# Patient Record
Sex: Female | Born: 1935 | Race: White | Hispanic: No | Marital: Married | Smoking: Never smoker
Health system: Southern US, Community
[De-identification: ages and names within clinical notes are randomized; demographics above are authoritative.]

## PROBLEM LIST (undated history)

## (undated) DIAGNOSIS — H3530 Unspecified macular degeneration: Secondary | ICD-10-CM

## (undated) DIAGNOSIS — IMO0002 Reserved for concepts with insufficient information to code with codable children: Secondary | ICD-10-CM

## (undated) DIAGNOSIS — E785 Hyperlipidemia, unspecified: Secondary | ICD-10-CM

## (undated) DIAGNOSIS — I10 Essential (primary) hypertension: Secondary | ICD-10-CM

## (undated) DIAGNOSIS — F0390 Unspecified dementia without behavioral disturbance: Secondary | ICD-10-CM

## (undated) HISTORY — PX: SKIN CANCER EXCISION: SHX779

## (undated) HISTORY — PX: CATARACT EXTRACTION: SUR2

---

## 2004-06-05 ENCOUNTER — Ambulatory Visit: Payer: Self-pay | Admitting: Internal Medicine

## 2005-01-22 ENCOUNTER — Ambulatory Visit: Payer: Self-pay | Admitting: Internal Medicine

## 2006-03-20 ENCOUNTER — Ambulatory Visit: Payer: Self-pay | Admitting: Internal Medicine

## 2007-03-25 ENCOUNTER — Ambulatory Visit: Payer: Self-pay | Admitting: Internal Medicine

## 2008-06-03 ENCOUNTER — Ambulatory Visit: Payer: Self-pay | Admitting: Internal Medicine

## 2009-06-28 ENCOUNTER — Ambulatory Visit: Payer: Self-pay | Admitting: Internal Medicine

## 2010-07-18 ENCOUNTER — Ambulatory Visit: Payer: Self-pay | Admitting: Internal Medicine

## 2011-08-28 ENCOUNTER — Ambulatory Visit: Payer: Self-pay | Admitting: Internal Medicine

## 2012-09-17 ENCOUNTER — Ambulatory Visit: Payer: Self-pay | Admitting: Neurology

## 2012-10-01 ENCOUNTER — Ambulatory Visit: Payer: Self-pay

## 2014-11-09 ENCOUNTER — Other Ambulatory Visit: Payer: Self-pay | Admitting: Physician Assistant

## 2014-11-09 DIAGNOSIS — M5441 Lumbago with sciatica, right side: Secondary | ICD-10-CM

## 2014-11-10 ENCOUNTER — Ambulatory Visit
Admission: RE | Admit: 2014-11-10 | Discharge: 2014-11-10 | Disposition: A | Discharge status: Home / Self Care | Payer: Medicare Other | Source: Ambulatory Visit | Attending: Physician Assistant | Admitting: Physician Assistant

## 2014-11-10 DIAGNOSIS — M47896 Other spondylosis, lumbar region: Secondary | ICD-10-CM | POA: Diagnosis not present

## 2014-11-10 DIAGNOSIS — M4856XA Collapsed vertebra, not elsewhere classified, lumbar region, initial encounter for fracture: Secondary | ICD-10-CM | POA: Insufficient documentation

## 2014-11-10 DIAGNOSIS — M5441 Lumbago with sciatica, right side: Secondary | ICD-10-CM

## 2014-11-10 DIAGNOSIS — M5186 Other intervertebral disc disorders, lumbar region: Secondary | ICD-10-CM | POA: Diagnosis not present

## 2014-11-18 ENCOUNTER — Encounter: Payer: Self-pay | Admitting: Emergency Medicine

## 2014-11-18 ENCOUNTER — Inpatient Hospital Stay
Admission: EM | Admit: 2014-11-18 | Discharge: 2014-11-22 | DRG: 394 | Disposition: A | Discharge status: Skilled Nursing Facility | Payer: Medicare Other | Attending: Internal Medicine | Admitting: Internal Medicine

## 2014-11-18 ENCOUNTER — Emergency Department: Payer: Medicare Other

## 2014-11-18 DIAGNOSIS — G8929 Other chronic pain: Secondary | ICD-10-CM | POA: Diagnosis present

## 2014-11-18 DIAGNOSIS — H3530 Unspecified macular degeneration: Secondary | ICD-10-CM | POA: Diagnosis present

## 2014-11-18 DIAGNOSIS — K649 Unspecified hemorrhoids: Secondary | ICD-10-CM | POA: Diagnosis present

## 2014-11-18 DIAGNOSIS — Z85828 Personal history of other malignant neoplasm of skin: Secondary | ICD-10-CM | POA: Diagnosis not present

## 2014-11-18 DIAGNOSIS — Z79891 Long term (current) use of opiate analgesic: Secondary | ICD-10-CM | POA: Diagnosis not present

## 2014-11-18 DIAGNOSIS — E785 Hyperlipidemia, unspecified: Secondary | ICD-10-CM | POA: Diagnosis present

## 2014-11-18 DIAGNOSIS — K625 Hemorrhage of anus and rectum: Secondary | ICD-10-CM

## 2014-11-18 DIAGNOSIS — K626 Ulcer of anus and rectum: Principal | ICD-10-CM | POA: Diagnosis present

## 2014-11-18 DIAGNOSIS — I10 Essential (primary) hypertension: Secondary | ICD-10-CM | POA: Diagnosis present

## 2014-11-18 DIAGNOSIS — K921 Melena: Secondary | ICD-10-CM | POA: Diagnosis present

## 2014-11-18 DIAGNOSIS — Z79899 Other long term (current) drug therapy: Secondary | ICD-10-CM

## 2014-11-18 DIAGNOSIS — M4850XA Collapsed vertebra, not elsewhere classified, site unspecified, initial encounter for fracture: Secondary | ICD-10-CM | POA: Diagnosis present

## 2014-11-18 DIAGNOSIS — Z7982 Long term (current) use of aspirin: Secondary | ICD-10-CM | POA: Diagnosis not present

## 2014-11-18 DIAGNOSIS — K5730 Diverticulosis of large intestine without perforation or abscess without bleeding: Secondary | ICD-10-CM | POA: Diagnosis present

## 2014-11-18 DIAGNOSIS — F0390 Unspecified dementia without behavioral disturbance: Secondary | ICD-10-CM | POA: Diagnosis present

## 2014-11-18 DIAGNOSIS — M549 Dorsalgia, unspecified: Secondary | ICD-10-CM | POA: Diagnosis present

## 2014-11-18 DIAGNOSIS — Z66 Do not resuscitate: Secondary | ICD-10-CM | POA: Diagnosis present

## 2014-11-18 DIAGNOSIS — K922 Gastrointestinal hemorrhage, unspecified: Secondary | ICD-10-CM | POA: Diagnosis present

## 2014-11-18 DIAGNOSIS — K5641 Fecal impaction: Secondary | ICD-10-CM | POA: Diagnosis present

## 2014-11-18 HISTORY — DX: Unspecified dementia, unspecified severity, without behavioral disturbance, psychotic disturbance, mood disturbance, and anxiety: F03.90

## 2014-11-18 HISTORY — DX: Reserved for concepts with insufficient information to code with codable children: IMO0002

## 2014-11-18 HISTORY — DX: Hyperlipidemia, unspecified: E78.5

## 2014-11-18 HISTORY — DX: Essential (primary) hypertension: I10

## 2014-11-18 HISTORY — DX: Unspecified macular degeneration: H35.30

## 2014-11-18 LAB — URINALYSIS COMPLETE WITH MICROSCOPIC (ARMC ONLY)
BACTERIA UA: NONE SEEN
Bilirubin Urine: NEGATIVE
Glucose, UA: NEGATIVE mg/dL
Leukocytes, UA: NEGATIVE
NITRITE: NEGATIVE
Protein, ur: NEGATIVE mg/dL
SQUAMOUS EPITHELIAL / LPF: NONE SEEN
Specific Gravity, Urine: 1.025 (ref 1.005–1.030)
pH: 6 (ref 5.0–8.0)

## 2014-11-18 LAB — COMPREHENSIVE METABOLIC PANEL
ALK PHOS: 148 U/L — AB (ref 38–126)
ALT: 35 U/L (ref 14–54)
AST: 38 U/L (ref 15–41)
Albumin: 3.4 g/dL — ABNORMAL LOW (ref 3.5–5.0)
Anion gap: 10 (ref 5–15)
BUN: 37 mg/dL — ABNORMAL HIGH (ref 6–20)
CHLORIDE: 105 mmol/L (ref 101–111)
CO2: 29 mmol/L (ref 22–32)
Calcium: 9.1 mg/dL (ref 8.9–10.3)
Creatinine, Ser: 0.81 mg/dL (ref 0.44–1.00)
GFR calc non Af Amer: >60 mL/min (ref >60)
GLUCOSE: 121 mg/dL — AB (ref 65–99)
POTASSIUM: 3.4 mmol/L — AB (ref 3.5–5.1)
Sodium: 144 mmol/L (ref 135–145)
Total Bilirubin: 0.6 mg/dL (ref 0.3–1.2)
Total Protein: 7.4 g/dL (ref 6.5–8.1)

## 2014-11-18 LAB — CBC WITH DIFFERENTIAL/PLATELET
Basophils Absolute: 0 10*3/uL (ref 0–0.1)
Basophils Relative: 0 %
Eosinophils Absolute: 0 10*3/uL (ref 0–0.7)
Eosinophils Relative: 0 %
HCT: 40.7 % (ref 35.0–47.0)
Hemoglobin: 13.3 g/dL (ref 12.0–16.0)
LYMPHS ABS: 1.6 10*3/uL (ref 1.0–3.6)
Lymphocytes Relative: 16 %
MCH: 28.9 pg (ref 26.0–34.0)
MCHC: 32.6 g/dL (ref 32.0–36.0)
MCV: 88.4 fL (ref 80.0–100.0)
Monocytes Absolute: 0.7 10*3/uL (ref 0.2–0.9)
Monocytes Relative: 7 %
Neutro Abs: 7.3 10*3/uL — ABNORMAL HIGH (ref 1.4–6.5)
Neutrophils Relative %: 77 %
PLATELETS: 307 10*3/uL (ref 150–440)
RBC: 4.6 MIL/uL (ref 3.80–5.20)
RDW: 15 % — ABNORMAL HIGH (ref 11.5–14.5)
WBC: 9.5 10*3/uL (ref 3.6–11.0)

## 2014-11-18 LAB — TYPE AND SCREEN
ABO/RH(D): O NEG
Antibody Screen: NEGATIVE

## 2014-11-18 LAB — HEMOGLOBIN
HEMOGLOBIN: 12.5 g/dL (ref 12.0–16.0)
HEMOGLOBIN: 12.2 g/dL (ref 12.0–16.0)

## 2014-11-18 LAB — ABO/RH: ABO/RH(D): O NEG

## 2014-11-18 LAB — TROPONIN I: Troponin I: <0.03 ng/mL (ref <0.031)

## 2014-11-18 LAB — TSH: TSH: 1.297 u[IU]/mL (ref 0.350–4.500)

## 2014-11-18 MED ORDER — CIPROFLOXACIN IN D5W 400 MG/200ML IV SOLN
400.0000 mg | Freq: Two times a day (BID) | INTRAVENOUS | Status: DC
  Administered 2014-11-18 – 2014-11-20 (×4): 400 mg via INTRAVENOUS
  Filled 2014-11-18 (×6): qty 200

## 2014-11-18 MED ORDER — METRONIDAZOLE IN NACL 5-0.79 MG/ML-% IV SOLN
INTRAVENOUS | Status: AC
  Administered 2014-11-18: 500 mg via INTRAVENOUS
  Filled 2014-11-18: qty 100

## 2014-11-18 MED ORDER — LATANOPROST 0.005 % OP SOLN
1.0000 [drp] | Freq: Every day | OPHTHALMIC | Status: DC
  Administered 2014-11-18 – 2014-11-21 (×4): 1 [drp] via OPHTHALMIC
  Filled 2014-11-18: qty 2.5

## 2014-11-18 MED ORDER — DONEPEZIL HCL 5 MG PO TABS
10.0000 mg | ORAL_TABLET | Freq: Every day | ORAL | Status: DC
  Administered 2014-11-18 – 2014-11-21 (×4): 10 mg via ORAL
  Filled 2014-11-18 (×4): qty 2

## 2014-11-18 MED ORDER — POLYETHYLENE GLYCOL 3350 17 G PO PACK
17.0000 g | PACK | Freq: Every day | ORAL | Status: DC
  Filled 2014-11-18: qty 1

## 2014-11-18 MED ORDER — ACETAMINOPHEN 650 MG RE SUPP: 650.0000 mg | Freq: Four times a day (QID) | RECTAL | Status: DC | PRN

## 2014-11-18 MED ORDER — SODIUM CHLORIDE 0.9 % IV SOLN
INTRAVENOUS | Status: DC
  Administered 2014-11-18 – 2014-11-19 (×2): via INTRAVENOUS

## 2014-11-18 MED ORDER — PANTOPRAZOLE SODIUM 40 MG IV SOLR
40.0000 mg | Freq: Once | INTRAVENOUS | Status: AC
  Administered 2014-11-18: 40 mg via INTRAVENOUS

## 2014-11-18 MED ORDER — PANTOPRAZOLE SODIUM 40 MG IV SOLR
INTRAVENOUS | Status: AC
  Administered 2014-11-18: 40 mg via INTRAVENOUS
  Filled 2014-11-18: qty 40

## 2014-11-18 MED ORDER — DOCUSATE SODIUM 100 MG PO CAPS
100.0000 mg | ORAL_CAPSULE | Freq: Two times a day (BID) | ORAL | Status: DC
  Administered 2014-11-18 – 2014-11-22 (×8): 100 mg via ORAL
  Filled 2014-11-18 (×8): qty 1

## 2014-11-18 MED ORDER — IOHEXOL 240 MG/ML SOLN
25.0000 mL | INTRAMUSCULAR | Status: AC
Start: 2014-11-18 — End: 2014-11-18

## 2014-11-18 MED ORDER — CIPROFLOXACIN IN D5W 400 MG/200ML IV SOLN
INTRAVENOUS | Status: AC
  Administered 2014-11-18: 400 mg via INTRAVENOUS
  Filled 2014-11-18: qty 200

## 2014-11-18 MED ORDER — OXYCODONE HCL 5 MG PO TABS
5.0000 mg | ORAL_TABLET | Freq: Four times a day (QID) | ORAL | Status: DC | PRN
  Administered 2014-11-19: 22:00:00 5 mg via ORAL
  Filled 2014-11-18: qty 1

## 2014-11-18 MED ORDER — AMLODIPINE BESYLATE 5 MG PO TABS
5.0000 mg | ORAL_TABLET | Freq: Every day | ORAL | Status: DC
  Administered 2014-11-18 – 2014-11-22 (×5): 5 mg via ORAL
  Filled 2014-11-18 (×5): qty 1

## 2014-11-18 MED ORDER — ACETAMINOPHEN 325 MG PO TABS: 650.0000 mg | ORAL_TABLET | Freq: Four times a day (QID) | ORAL | Status: DC | PRN

## 2014-11-18 MED ORDER — MEMANTINE HCL 10 MG PO TABS
10.0000 mg | ORAL_TABLET | Freq: Two times a day (BID) | ORAL | Status: DC
Start: 2014-11-18 — End: 2014-11-22
  Administered 2014-11-18 – 2014-11-22 (×8): 10 mg via ORAL
  Filled 2014-11-18 (×8): qty 1

## 2014-11-18 MED ORDER — NORTRIPTYLINE HCL 10 MG PO CAPS
10.0000 mg | ORAL_CAPSULE | Freq: Every day | ORAL | Status: DC
  Administered 2014-11-19 – 2014-11-22 (×4): 10 mg via ORAL
  Filled 2014-11-18 (×6): qty 1

## 2014-11-18 MED ORDER — IOHEXOL 300 MG/ML  SOLN
80.0000 mL | Freq: Once | INTRAMUSCULAR | Status: AC | PRN
  Administered 2014-11-18: 80 mL via INTRAVENOUS

## 2014-11-18 MED ORDER — METRONIDAZOLE IN NACL 5-0.79 MG/ML-% IV SOLN
500.0000 mg | Freq: Three times a day (TID) | INTRAVENOUS | Status: DC
  Administered 2014-11-18 – 2014-11-20 (×6): 500 mg via INTRAVENOUS
  Filled 2014-11-18 (×9): qty 100

## 2014-11-18 NOTE — ED Notes (Signed)
Patient denies pain and is resting comfortably.  

## 2014-11-18 NOTE — ED Notes (Signed)
Pt husband reports that when he changed her diaper there was blood in it, some on her leg and in the toliet

## 2014-11-18 NOTE — Consult Note (Signed)
GI Inpatient Consult Note  Reason for Consult: rectal bleeding   Attending Requesting Consult: Patel  History of Present Illness: Kathryn Aguilar is a 81 y.o. female with past medical history notable for dementia who is presenting for evaluation of lower GI bleeding. The patient is unable to give a history status history is from the husband. He reports she was in her usual state of health until 1 day prior to arrival when he noticed a significant amount of blood in her diaper. Blood was  bright red. No black or maroon stool. She did not report any symptoms prior to this such as abdominal pain he is unaware of her having any prior episodes of GI bleeding other than this. He reports she had a colonoscopy many years ago but he has no idea that time were the findings on that study.  Since being brought to the emergency room she has had 1 or 2 further episodes of rectal bleeding. She denies any nausea vomiting dysphagia GERD fevers chills    CT scan of the abdomen and pelvis was done and shows stool in the rectum along with possible rectal consisting suggestive of proctitis.  Past Medical History:  Past Medical History  Diagnosis Date  . Compression fracture   . Dementia   . Hypertension   . Macular degeneration   . Hyperlipemia     Problem List: Patient Active Problem List   Diagnosis Date Noted  . Lower GI bleed 11/18/2014    Past Surgical History: Past Surgical History  Procedure Laterality Date  . Cataract extraction    . Skin cancer excision      Allergies: No Known Allergies  Home Medications: Prescriptions prior to admission  Medication Sig Dispense Refill Last Dose  . amLODipine (NORVASC) 5 MG tablet Take 5 mg by mouth daily.   11/17/2014 at am  . aspirin EC 81 MG tablet Take 81 mg by mouth daily.   11/17/2014 at am  . donepezil (ARICEPT) 10 MG tablet Take 10 mg by mouth at bedtime.   11/17/2014 at pm  . latanoprost (XALATAN) 0.005 % ophthalmic solution Place 1 drop into both  eyes at bedtime.   11/17/2014 at pm  . memantine (NAMENDA) 10 MG tablet Take 10 mg by mouth 2 (two) times daily.   11/17/2014 at pm  . Multiple Vitamins-Minerals (CENTRAL-VITE PO) Take 1 tablet by mouth daily. At lunchtime   11/17/2014 at pm  . Multiple Vitamins-Minerals (PRESERVISION AREDS) TABS Take 1 tablet by mouth 2 (two) times daily.   11/17/2014 at pm  . nortriptyline (PAMELOR) 10 MG capsule Take 10 mg by mouth daily.   11/08/2014 at am  . oxyCODONE (OXY IR/ROXICODONE) 5 MG immediate release tablet Take 5-10 mg by mouth every 6 (six) hours as needed for severe pain.   11/18/2014 at 0300   Home medication reconciliation was completed with the patient.   Scheduled Inpatient Medications:   . amLODipine  5 mg Oral Daily  . ciprofloxacin  400 mg Intravenous Q12H  . docusate sodium  100 mg Oral BID  . donepezil  10 mg Oral QHS  . latanoprost  1 drop Both Eyes QHS  . memantine  10 mg Oral BID  . metronidazole  500 mg Intravenous Q8H  . nortriptyline  10 mg Oral Daily  . polyethylene glycol  17 g Oral Daily    Continuous Inpatient Infusions:   . sodium chloride 75 mL/hr at 11/18/14 1618    PRN Inpatient Medications:  acetaminophen **  OR** acetaminophen, oxyCODONE  Family History: family history includes Heart disease in her mother; Stomach cancer in her father.    Social History:   reports that she has never smoked. She does not have any smokeless tobacco history on file. She reports that she does not drink alcohol or use illicit drugs.   Review of Systems: Constitutional: Weight is stable.  Eyes: No changes in vision. ENT: No oral lesions, sore throat.  GI: see HPI.  Heme/Lymph: No easy bruising.  CV: No chest pain.  GU: No hematuria.  Integumentary: No rashes.  Neuro: No headaches.  Psych: No depression/anxiety.  Endocrine: No heat/cold intolerance.  Allergic/Immunologic: No urticaria.  Resp: No cough, SOB.  Musculoskeletal: No joint swelling.    Physical  Examination: BP 174/80 mmHg  Pulse 91  Temp(Src) 99.1 F (37.3 C) (Oral)  Resp 20  Ht 5' 6" (1.676 m)  Wt 115 lb (52.164 kg)  BMI 18.57 kg/m2  SpO2 99% Gen: NAD, alert and oriented x 1 HEENT: PEERLA, EOMI, Neck: supple, no JVD or thyromegaly Chest: CTA bilaterally, no wheezes, crackles, or other adventitious sounds CV: RRR, no m/g/c/r Abd: soft, NT, ND, +BS in all four quadrants; no HSM, guarding, ridigity, or rebound tenderness Ext: no edema, well perfused with 2+ pulses, Skin: no rash or lesions noted Lymph: no LAD  Data: Lab Results  Component Value Date   WBC 9.5 11/18/2014   HGB 12.5 11/18/2014   HCT 40.7 11/18/2014   MCV 88.4 11/18/2014   PLT 307 11/18/2014    Recent Labs Lab 11/18/14 0730 11/18/14 1221  HGB 13.3 12.5   Lab Results  Component Value Date   NA 144 11/18/2014   K 3.4* 11/18/2014   CL 105 11/18/2014   CO2 29 11/18/2014   BUN 37* 11/18/2014   CREATININE 0.81 11/18/2014   Lab Results  Component Value Date   ALT 35 11/18/2014   AST 38 11/18/2014   ALKPHOS 148* 11/18/2014   BILITOT 0.6 11/18/2014   No results for input(s): APTT, INR, PTT in the last 168 hours.   Assessment/Plan:  [name] is a 81 y.o. female with several episodes of rectal bleeding. Her hemoglobin has trended down just slightly but still is in the normal range. She does have possible mild proctitis on her CT scan. The bleeding is likely either diverticular or from the proctitis. Given her severe dementia, a watch and wait strategy would be recommended at this time. If she has further have her heavy bleeding I would pursue a bleeding scan. We will also consider a flexible sigmoidoscopy to investigate the proctitis seen on CT scan.  Recommendations: - follow Hgb - bleeding scan for further active bleeding - will consider flex sig tomorrow for further bleeding, but will try to avoid invasive measures given the severe dementia.   Thank you for the consult. Please call with  questions or concerns.  Fionn Stracke GORDON, MD    

## 2014-11-18 NOTE — H&P (Signed)
[facility] [place] [facility]   PATIENT NAME: Kathryn Aguilar    MR#:  [id number]  DATE OF BIRTH:  Oct 30, 1925  DATE OF ADMISSION:  11/18/2014  PRIMARY CARE PHYSICIAN: Clydie Braun, MD   REQUESTING/REFERRING PHYSICIAN: Daryel November  CHIEF COMPLAINT:   Chief Complaint  Patient presents with  . Rectal Bleeding    HISTORY OF PRESENT ILLNESS: Kathryn Aguilar  is a 81 y.o. female with a known history of  Dementia, who currently resides at [facility] who is taken care of by her husband mainly. He reports that she has had some semisolid stools over the past few days. Today he was changing when he noticed that she had bright red blood all over her diapers. Therefore he decided to bring her to the hospital. Patient had a CT scan of the abdomen which showed Increased stool in rectum with probable inferior rectal wall thickening suspicious for proctitis. Patient has advanced dementia and is unable to provide me any history although history was obtained by the husband  PAST MEDICAL HISTORY:   Past Medical History  Diagnosis Date  . Compression fracture   . Dementia   . Hypertension   . Macular degeneration   . Hyperlipemia     PAST SURGICAL HISTORY:  Past Surgical History  Procedure Laterality Date  . Cataract extraction    . Skin cancer excision      SOCIAL HISTORY:  History  Substance Use Topics  . Smoking status: Never Smoker   . Smokeless tobacco: Not on file  . Alcohol Use: No    FAMILY HISTORY:  Family History  Problem Relation Age of Onset  . Heart disease Mother   . Stomach cancer Father     DRUG ALLERGIES: No Known Allergies  REVIEW OF SYSTEMS:  Able to obtain due to advanced dementia  MEDICATIONS AT HOME:  Prior to Admission medications   Medication Sig Start Date End Date Taking? Authorizing Provider  amLODipine (NORVASC) 5 MG tablet Take 5 mg by mouth daily.   Yes Historical Provider, MD   aspirin EC 81 MG tablet Take 81 mg by mouth daily.   Yes Historical Provider, MD  donepezil (ARICEPT) 10 MG tablet Take 10 mg by mouth at bedtime.   Yes Historical Provider, MD  latanoprost (XALATAN) 0.005 % ophthalmic solution Place 1 drop into both eyes at bedtime.   Yes Historical Provider, MD  memantine (NAMENDA) 10 MG tablet Take 10 mg by mouth 2 (two) times daily.   Yes Historical Provider, MD  Multiple Vitamins-Minerals (CENTRAL-VITE PO) Take 1 tablet by mouth daily. At lunchtime   Yes Historical Provider, MD  Multiple Vitamins-Minerals (PRESERVISION AREDS) TABS Take 1 tablet by mouth 2 (two) times daily.   Yes Historical Provider, MD  nortriptyline (PAMELOR) 10 MG capsule Take 10 mg by mouth daily. 11/08/14  Yes Historical Provider, MD  oxyCODONE (OXY IR/ROXICODONE) 5 MG immediate release tablet Take 5-10 mg by mouth every 6 (six) hours as needed for severe pain.   Yes Historical Provider, MD      PHYSICAL EXAMINATION:   VITAL SIGNS: Blood pressure 182/82, pulse 84, resp. rate 18, height  (1.676 m), weight 52.164 kg (115 lb), SpO2 98 %.  GENERAL:  81 y.o.-year-old patient lying in the bed with no acute distress.  EYES: Pupils equal, round, reactive to light and accommodation. No scleral icterus. Extraocular muscles intact.  HEENT: Head atraumatic, normocephalic. Oropharynx and nasopharynx clear.  NECK:  Supple, no jugular venous distention. No thyroid enlargement, no tenderness.  LUNGS: Normal breath sounds bilaterally, no wheezing, rales,rhonchi or crepitation. No use of accessory muscles of respiration.  CARDIOVASCULAR: S1, S2 normal. No murmurs, rubs, or gallops.  ABDOMEN: Soft, nontender, nondistended. Bowel sounds present. No organomegaly or mass.  EXTREMITIES: No pedal edema, cyanosis, or clubbing.  NEUROLOGIC: Cranial nerves II through XII are intact. Limited exam due to her dementia  PSYCHIATRIC: The patient is awake but not oriented to place person or time SKIN: No  obvious rash, lesion, or ulcer.   LABORATORY PANEL:   CBC  Recent Labs Lab 11/18/14 0730  WBC 9.5  HGB 13.3  HCT 40.7  PLT 307  MCV 88.4  MCH 28.9  MCHC 32.6  RDW 15.0*  LYMPHSABS 1.6  MONOABS 0.7  EOSABS 0.0  BASOSABS 0.0   ------------------------------------------------------------------------------------------------------------------  Chemistries   Recent Labs Lab 11/18/14 0730  NA 144  K 3.4*  CL 105  CO2 29  GLUCOSE 121*  BUN 37*  CREATININE 0.81  CALCIUM 9.1  AST 38  ALT 35  ALKPHOS 148*  BILITOT 0.6   ------------------------------------------------------------------------------------------------------------------ estimated creatinine clearance is 41.1 mL/min (by C-G formula based on Cr of 0.81). ------------------------------------------------------------------------------------------------------------------ No results for input(s): TSH, T4TOTAL, T3FREE, THYROIDAB in the last 72 hours.  Invalid input(s): FREET3   Coagulation profile No results for input(s): INR, PROTIME in the last 168 hours. ------------------------------------------------------------------------------------------------------------------- No results for input(s): DDIMER in the last 72 hours. -------------------------------------------------------------------------------------------------------------------  Cardiac Enzymes  Recent Labs Lab 11/18/14 0730  TROPONINI <0.03   ------------------------------------------------------------------------------------------------------------------ Invalid input(s): POCBNP  ---------------------------------------------------------------------------------------------------------------  Urinalysis    Component Value Date/Time   COLORURINE YELLOW* 11/18/2014 0828   APPEARANCEUR CLEAR* 11/18/2014 0828   LABSPEC 1.025 11/18/2014 0828   PHURINE 6.0 11/18/2014 0828   GLUCOSEU NEGATIVE 11/18/2014 0828   HGBUR 1+* 11/18/2014 0828    BILIRUBINUR NEGATIVE 11/18/2014 0828   KETONESUR TRACE* 11/18/2014 0828   PROTEINUR NEGATIVE 11/18/2014 0828   NITRITE NEGATIVE 11/18/2014 0828   LEUKOCYTESUR NEGATIVE 11/18/2014 0828     RADIOLOGY: Ct Abdomen Pelvis W Contrast  11/18/2014   CLINICAL DATA:  Rectal bleeding, history hypertension, dementia  EXAM: CT ABDOMEN AND PELVIS WITH CONTRAST  TECHNIQUE: Multidetector CT imaging of the abdomen and pelvis was performed using the standard protocol following bolus administration of intravenous contrast. Sagittal and coronal MPR images reconstructed from axial data set.  CONTRAST:  60mL OMNIPAQUE IOHEXOL 300 MG/ML SOLN IV. Dilute oral contrast.  COMPARISON:  None  FINDINGS: Calcified granuloma RIGHT lower lobe image 7.  Calcified splenic granulomata.  Gallbladder distended without gross wall thickening or calcification.  Tiny probable RIGHT renal cysts.  Liver, spleen, pancreas, kidneys, and adrenal glands otherwise normal.  Stomach and small bowel loops normal appearance.  Prominent stool in rectum and RIGHT colon.  Probable inferior rectal wall thickening.  Sigmoid diverticulosis without evidence of diverticulitis.  Appendix not definitely visualized.  Atrophic uterus with unremarkable adnexa.  Tiny amount of nonspecific fluid within vagina.  No other definite acute intra-abdominal or intrapelvic abnormalities.  Bladder and ureters unremarkable.  Small RIGHT adductor intramuscular lipoma.  Scattered atherosclerotic calcifications.  No mass, adenopathy, free fluid or free air.  Bones diffusely demineralized with old L3 inferior endplate compression fracture.  IMPRESSION: Increased stool in rectum with probable inferior rectal wall thickening suspicious for proctitis.  Sigmoid diverticulosis.  Old granulomatous disease.   Electronically Signed   By: Ulyses Southward M.D.   On: 11/18/2014 11:02    EKG:  Orders placed or performed during the hospital encounter of 11/18/14  . ED EKG  . ED EKG    IMPRESSION  AND PLAN: Patient is a 81 year old white female with history of dementia presents with bright red blood per rectum  1. Bright red blood per rectum: Suspected due to diverticular bleed, however she does have evidence of possible proctitis which also could be causing the bleed. I will have GI evaluate the patient. Follow her hemoglobin and transfuse as needed. We'll place her on anabiotic's until GI has seen the patient. She also has stool burden noted I will place her on stool softeners. Patient consented to transfusion if needed risk and benefits explained he is agreeable to transfusion  2. Hypertension continue Norvasc as taking at home  3. Dementia continue  Aricept and Namenda as taking at home  4. Give her degeneration continue eyedrops  5. Chronic back pain due to impression fractures: Into new pain medications PT evaluation  6. Miscellaneous: SCDs for DVT prophylaxis    All the records are reviewed and case discussed with ED provider. Management plans discussed with the patient, family and they are in agreement.  CODE STATUS:CODE STATUS discussed with the husband he states that based on her dementia he ordered he has advanced directives in place and would not want her resuscitated no chest compressions no intubation.    TOTAL TIME TAKING CARE OF THIS PATIENT: 55 minutes   Auburn Bilberry M.D on 11/18/2014 at 12:14 PM  Between 7am to 6pm - Pager - [phone]  After 6pm go to [email or web address] - password [email or web address] [facility]  [facility]  Office  [phone]  CC: Primary care physician; Clydie Braun, MD

## 2014-11-18 NOTE — ED Notes (Signed)
Patient is resting comfortably. 

## 2014-11-18 NOTE — Plan of Care (Signed)
Problem: Discharge Progression Outcomes Goal: Discharge plan in place and appropriate Outcome: Progressing Individualization Pt goes by [facility]. Lives with husband at [facility] Hypertension , dementia, macular degeneration managed with home medications. Right hib lower back compression fracture: surggery is not an option at this time. Goal: Other Discharge Outcomes/Goals Outcome: Progressing Pt has had only on stool on arrival to unit.  Husband at  Bedside.

## 2014-11-18 NOTE — ED Provider Notes (Signed)
[facility] Emergency Department Provider Note     Time seen: ----------------------------------------- 7:32 AM on 11/18/2014 -----------------------------------------    I have reviewed the triage vital signs and the nursing notes.  L5 caveat: Review of systems and history is difficult to obtain due to baseline dementia. Report is from her husband HISTORY  Chief Complaint Rectal Bleeding    HPI Kathryn Aguilar is a 81 y.o. female who presents ER for blood in her diaper. According to report her husband went to change her diaper and there was blood in it. His not sure if this is vaginal or rectal bleeding. She does not have a history of same. He denies that she's had any recent illness, only takes a baby aspirin daily. She denies any complaints chronic.   No past medical history on file.  There are no active problems to display for this patient.   No past surgical history on file.  Allergies Review of patient's allergies indicates not on file.  Social History History  Substance Use Topics  . Smoking status: Not on file  . Smokeless tobacco: Not on file  . Alcohol Use: Not on file    Review of Systems Constitutional: Negative for fever. Eyes: Negative for visual changes. ENT: Negative for sore throat. Cardiovascular: Negative for chest pain. Respiratory: Negative for shortness of breath. Gastrointestinal: Negative for abdominal pain, vomiting and diarrhea. Positive for bleeding, possibly rectal Genitourinary: Negative for dysuria. Positive for bleeding, possibly vaginal Musculoskeletal: Negative for back pain. Skin: Negative for rash. Neurological: Negative for headaches, focal weakness or numbness.  10-point ROS otherwise negative.  ____________________________________________   PHYSICAL EXAM:  VITAL SIGNS: ED Triage Vitals  Enc Vitals Group     BP 11/18/14 0728 147/64 mmHg     Pulse Rate 11/18/14 0728 82     Resp 11/18/14 0728  20     Temp --      Temp src --      SpO2 11/18/14 0728 95 %     Weight 11/18/14 0728 115 lb (52.164 kg)     Height 11/18/14 0728  (1.676 m)     Head Cir --      Peak Flow --      Pain Score --      Pain Loc --      Pain Edu? --      Excl. in GC? --     Constitutional: Alert but disoriented, no acute distress Eyes: Conjunctivae are normal. PERRL. Normal extraocular movements. ENT   Head: Normocephalic and atraumatic.   Nose: No congestion/rhinnorhea.   Mouth/Throat: Mucous membranes are moist.   Neck: No stridor. Cardiovascular: Normal rate, regular rhythm. Normal and symmetric distal pulses are present in all extremities. No murmurs, rubs, or gallops. Respiratory: Normal respiratory effort without tachypnea nor retractions. Breath sounds are clear and equal bilaterally. No wheezes/rales/rhonchi. Gastrointestinal: Soft and nontender. No distention. No abdominal bruits. There is no CVA tenderness. Musculoskeletal: Nontender with normal range of motion in all extremities. No joint effusions.  No lower extremity tenderness nor edema. Rectal: There is bright red blood per rectum Neurologic:  Normal speech and language. No gross focal neurologic deficits are appreciated. Speech is normal. No gait instability. Skin:  Skin is warm, dry and intact. No rash noted. Psychiatric: Mood and affect are normal. Speech and behavior are normal. Patient exhibits appropriate insight and judgment. ____________________________________________  EKG: Interpreted by me. Normal sinus rhythm with a rate of 85, PACs, LVH, nonspecific ST and  T wave changes, normal axis.  ____________________________________________  ED COURSE:  Pertinent labs & imaging results that were available during my care of the patient were reviewed by me and considered in my medical decision making (see chart for details). We will need to determine where her bleeding is coming from. We'll check basic labs and  reevaluate. ____________________________________________    LABS (pertinent positives/negatives)  Labs Reviewed  CBC WITH DIFFERENTIAL/PLATELET - Abnormal; Notable for the following:    RDW 15.0 (*)    Neutro Abs 7.3 (*)    All other components within normal limits  COMPREHENSIVE METABOLIC PANEL - Abnormal; Notable for the following:    Potassium 3.4 (*)    Glucose, Bld 121 (*)    BUN 37 (*)    Albumin 3.4 (*)    Alkaline Phosphatase 148 (*)    All other components within normal limits  TROPONIN I  URINALYSIS COMPLETEWITH MICROSCOPIC ([facility] ONLY)  TYPE AND SCREEN  ABO/RH    RADIOLOGY  CT abdomen and pelvis  IMPRESSION: Increased stool in rectum with probable inferior rectal wall thickening suspicious for proctitis.  Sigmoid diverticulosis.  Old granulomatous disease. ____________________________________________  FINAL ASSESSMENT AND PLAN  Rectal bleeding   Plan: Patient with rectal bleeding here, recommended observation and serial H&H. She was given some IV Protonix to cover for upper GI bleed although this is likely all lower GI diverticulosis related. Suspicious findings for proctitis on CT scan. I will discuss with the hospitalist for admission.   Emily Filbert, MD   Emily Filbert, MD 11/18/14 [phone]

## 2014-11-18 NOTE — ED Notes (Signed)
Family at bedside. 

## 2014-11-19 ENCOUNTER — Encounter
Admission: EM | Disposition: A | Discharge status: Skilled Nursing Facility | Payer: Self-pay | Source: Home / Self Care | Attending: Internal Medicine

## 2014-11-19 HISTORY — PX: FLEXIBLE SIGMOIDOSCOPY: SHX5431

## 2014-11-19 LAB — HEMOGLOBIN
Hemoglobin: 11.6 g/dL — ABNORMAL LOW (ref 12.0–16.0)
Hemoglobin: 11.4 g/dL — ABNORMAL LOW (ref 12.0–16.0)

## 2014-11-19 LAB — BASIC METABOLIC PANEL
Anion gap: 8 (ref 5–15)
BUN: 20 mg/dL (ref 6–20)
CALCIUM: 8.1 mg/dL — AB (ref 8.9–10.3)
CO2: 30 mmol/L (ref 22–32)
CREATININE: 0.67 mg/dL (ref 0.44–1.00)
Chloride: 103 mmol/L (ref 101–111)
GFR calc Af Amer: >60 mL/min (ref >60)
GLUCOSE: 111 mg/dL — AB (ref 65–99)
Potassium: 3.2 mmol/L — ABNORMAL LOW (ref 3.5–5.1)
SODIUM: 141 mmol/L (ref 135–145)

## 2014-11-19 LAB — CBC
HCT: 35.4 % (ref 35.0–47.0)
Hemoglobin: 11.7 g/dL — ABNORMAL LOW (ref 12.0–16.0)
MCH: 29.2 pg (ref 26.0–34.0)
MCHC: 33 g/dL (ref 32.0–36.0)
MCV: 88.3 fL (ref 80.0–100.0)
Platelets: 251 10*3/uL (ref 150–440)
RBC: 4.01 MIL/uL (ref 3.80–5.20)
RDW: 14.7 % — AB (ref 11.5–14.5)
WBC: 6 10*3/uL (ref 3.6–11.0)

## 2014-11-19 SURGERY — SIGMOIDOSCOPY, FLEXIBLE
Anesthesia: Moderate Sedation

## 2014-11-19 MED ORDER — SODIUM CHLORIDE 0.9 % IV SOLN: INTRAVENOUS | Status: DC

## 2014-11-19 MED ORDER — POTASSIUM CHLORIDE 20 MEQ PO PACK
40.0000 meq | PACK | Freq: Once | ORAL | Status: AC
  Administered 2014-11-19: 16:00:00 40 meq via ORAL
  Filled 2014-11-19: qty 2

## 2014-11-19 MED ORDER — HALOPERIDOL LACTATE 5 MG/ML IJ SOLN
5.0000 mg | Freq: Once | INTRAMUSCULAR | Status: AC
  Administered 2014-11-19: 03:00:00 5 mg via INTRAVENOUS
  Filled 2014-11-19: qty 1

## 2014-11-19 MED ORDER — POLYETHYLENE GLYCOL 3350 17 G PO PACK
17.0000 g | PACK | Freq: Two times a day (BID) | ORAL | Status: DC
  Administered 2014-11-19 – 2014-11-22 (×7): 17 g via ORAL
  Filled 2014-11-19 (×6): qty 1

## 2014-11-19 NOTE — Progress Notes (Signed)
Pt pulled out IV, becoming more and more agitated. Attempting to get OOB unassisted. Difficult to redirect. Notified Dr Sheryle Hail. Order given for 5mg  of Haldol IV.Geri Seminole, RN

## 2014-11-19 NOTE — Interval H&P Note (Signed)
History and Physical Interval Note:  11/19/2014 11:51 AM  Kathryn Aguilar  has presented today for surgery, with the diagnosis of rectal bleeding  The various methods of treatment have been discussed with the patient and husband. After consideration of risks, benefits and other options for treatment, the patient has consented to  Procedure(s): FLEXIBLE SIGMOIDOSCOPY (N/A) as a surgical intervention .  The patient's history has been reviewed, patient examined, no change in status, stable for surgery.  I have reviewed the patient's chart and labs.  Questions were answered to the patient's satisfaction.     [name]

## 2014-11-19 NOTE — Care Management (Addendum)
Admitted to [facility] with the diagnosis of lower GI bleed. Lives with husband, Lyman Bishop, [phone]). Lives at [facility]. [facility] outpatient [facility] therapy. Home Health in the past thru [place]. Uses a rolling walker to aide in ambulation. Never been to [facility] for rehabilitation. Poor appetite per husband. Sees Dr. Sampson Goon. Seen PA about a month ago. NPO. Scheduled for Flexible Sigmoid today.  Gwenette Greet RN MSN Care Management [phone]

## 2014-11-19 NOTE — H&P (View-Only) (Signed)
GI Inpatient Consult Note  Reason for Consult: rectal bleeding   Attending Requesting Consult: Allena Katz  History of Present Illness: Kathryn Aguilar is a 81 y.o. female with past medical history notable for dementia who is presenting for evaluation of lower GI bleeding. The patient is unable to give a history status history is from the husband. He reports she was in her usual state of health until 1 day prior to arrival when he noticed a significant amount of blood in her diaper. Blood was  bright red. No black or maroon stool. She did not report any symptoms prior to this such as abdominal pain he is unaware of her having any prior episodes of GI bleeding other than this. He reports she had a colonoscopy many years ago but he has no idea that time were the findings on that study.  Since being brought to the emergency room she has had 1 or 2 further episodes of rectal bleeding. She denies any nausea vomiting dysphagia GERD fevers chills    CT scan of the abdomen and pelvis was done and shows stool in the rectum along with possible rectal consisting suggestive of proctitis.  Past Medical History:  Past Medical History  Diagnosis Date  . Compression fracture   . Dementia   . Hypertension   . Macular degeneration   . Hyperlipemia     Problem List: Patient Active Problem List   Diagnosis Date Noted  . Lower GI bleed 11/18/2014    Past Surgical History: Past Surgical History  Procedure Laterality Date  . Cataract extraction    . Skin cancer excision      Allergies: No Known Allergies  Home Medications: Prescriptions prior to admission  Medication Sig Dispense Refill Last Dose  . amLODipine (NORVASC) 5 MG tablet Take 5 mg by mouth daily.   11/17/2014 at am  . aspirin EC 81 MG tablet Take 81 mg by mouth daily.   11/17/2014 at am  . donepezil (ARICEPT) 10 MG tablet Take 10 mg by mouth at bedtime.   11/17/2014 at pm  . latanoprost (XALATAN) 0.005 % ophthalmic solution Place 1 drop into both  eyes at bedtime.   11/17/2014 at pm  . memantine (NAMENDA) 10 MG tablet Take 10 mg by mouth 2 (two) times daily.   11/17/2014 at pm  . Multiple Vitamins-Minerals (CENTRAL-VITE PO) Take 1 tablet by mouth daily. At lunchtime   11/17/2014 at pm  . Multiple Vitamins-Minerals (PRESERVISION AREDS) TABS Take 1 tablet by mouth 2 (two) times daily.   11/17/2014 at pm  . nortriptyline (PAMELOR) 10 MG capsule Take 10 mg by mouth daily.   11/08/2014 at am  . oxyCODONE (OXY IR/ROXICODONE) 5 MG immediate release tablet Take 5-10 mg by mouth every 6 (six) hours as needed for severe pain.   11/18/2014 at 0300   Home medication reconciliation was completed with the patient.   Scheduled Inpatient Medications:   . amLODipine  5 mg Oral Daily  . ciprofloxacin  400 mg Intravenous Q12H  . docusate sodium  100 mg Oral BID  . donepezil  10 mg Oral QHS  . latanoprost  1 drop Both Eyes QHS  . memantine  10 mg Oral BID  . metronidazole  500 mg Intravenous Q8H  . nortriptyline  10 mg Oral Daily  . polyethylene glycol  17 g Oral Daily    Continuous Inpatient Infusions:   . sodium chloride 75 mL/hr at 11/18/14 1618    PRN Inpatient Medications:  acetaminophen **  OR** acetaminophen, oxyCODONE  Family History: family history includes Heart disease in her mother; Stomach cancer in her father.    Social History:   reports that she has never smoked. She does not have any smokeless tobacco history on file. She reports that she does not drink alcohol or use illicit drugs.   Review of Systems: Constitutional: Weight is stable.  Eyes: No changes in vision. ENT: No oral lesions, sore throat.  GI: see HPI.  Heme/Lymph: No easy bruising.  CV: No chest pain.  GU: No hematuria.  Integumentary: No rashes.  Neuro: No headaches.  Psych: No depression/anxiety.  Endocrine: No heat/cold intolerance.  Allergic/Immunologic: No urticaria.  Resp: No cough, SOB.  Musculoskeletal: No joint swelling.    Physical  Examination: BP 174/80 mmHg  Pulse 91  Temp(Src) 99.1 F (37.3 C) (Oral)  Resp 20  Ht 5\' 6"  (1.676 m)  Wt 115 lb (52.164 kg)  BMI 18.57 kg/m2  SpO2 99% Gen: NAD, alert and oriented x 1 HEENT: PEERLA, EOMI, Neck: supple, no JVD or thyromegaly Chest: CTA bilaterally, no wheezes, crackles, or other adventitious sounds CV: RRR, no m/g/c/r Abd: soft, NT, ND, +BS in all four quadrants; no HSM, guarding, ridigity, or rebound tenderness Ext: no edema, well perfused with 2+ pulses, Skin: no rash or lesions noted Lymph: no LAD  Data: Lab Results  Component Value Date   WBC 9.5 11/18/2014   HGB 12.5 11/18/2014   HCT 40.7 11/18/2014   MCV 88.4 11/18/2014   PLT 307 11/18/2014    Recent Labs Lab 11/18/14 0730 11/18/14 1221  HGB 13.3 12.5   Lab Results  Component Value Date   NA 144 11/18/2014   K 3.4* 11/18/2014   CL 105 11/18/2014   CO2 29 11/18/2014   BUN 37* 11/18/2014   CREATININE 0.81 11/18/2014   Lab Results  Component Value Date   ALT 35 11/18/2014   AST 38 11/18/2014   ALKPHOS 148* 11/18/2014   BILITOT 0.6 11/18/2014   No results for input(s): APTT, INR, PTT in the last 168 hours.   Assessment/Plan:  [name] is a 81 y.o. female with several episodes of rectal bleeding. Her hemoglobin has trended down just slightly but still is in the normal range. She does have possible mild proctitis on her CT scan. The bleeding is likely either diverticular or from the proctitis. Given her severe dementia, a watch and wait strategy would be recommended at this time. If she has further have her heavy bleeding I would pursue a bleeding scan. We will also consider a flexible sigmoidoscopy to investigate the proctitis seen on CT scan.  Recommendations: - follow Hgb - bleeding scan for further active bleeding - will consider flex sig tomorrow for further bleeding, but will try to avoid invasive measures given the severe dementia.   Thank you for the consult. Please call with  questions or concerns.  Katarzyna Wolven, Addison Naegeli, MD

## 2014-11-19 NOTE — Progress Notes (Signed)
Nurse administered soaps enema with assist of CNA, patient only had small amount of brownish drainage, most output was clear. Patient tolerated without difficulty. Husband is at bedside.

## 2014-11-19 NOTE — Plan of Care (Signed)
Problem: Discharge Progression Outcomes Goal: Other Discharge Outcomes/Goals Outcome: Progressing Patient did have flex sig today showed ulceration and hemorrhoids.  2 bloody stools today.  VSS, HG stable at 11.6.  Maybe discharging tomorrow.

## 2014-11-19 NOTE — Progress Notes (Signed)
PT Hold Note  Patient Details Name: Kathryn Aguilar MRN: [id number] DOB: Aug 18, 1925   Cancelled Treatment:    Reason Eval/Treat Not Completed: Medical issues which prohibited therapy. Chart reviewed and spoke with RN. Pt has returned to room after procedure but MD still has not authorized the official transfer back to room by readdressing orders. She has paged MD twice and has not received a return call. Currently all of patient's medications are on hold and she has not received a meal tray. RN is requesting that PT wait to perform evaluation until she has heard back from MD and completed transfer. Will attempt PT evaluation on later date as medically appropriate.  Sharalyn Ink Gyanna Jarema PT, DPT   Renleigh Ouellet 11/19/2014, 2:37 PM

## 2014-11-19 NOTE — Progress Notes (Signed)
Flex sig today:    Rectal ulceration adjacent to hard stool.  Suspect this is stercoral ulcer which is causing the bleeding.   Recs: - BID tap water enemas. - miralax 17 BID - may need manual disimpaction.

## 2014-11-19 NOTE — Progress Notes (Signed)
PT Attempt Note  Patient Details Name: Kathryn Aguilar MRN: [id number] DOB: 1926/03/02   Cancelled Treatment:    Reason Eval/Treat Not Completed: Patient at procedure or test/unavailable Chart reviewed. Attempted to evaluate but pt was on her way out of room for a procedure/test. Will attempt treatment at later time/date as patient is available.  Sharalyn Ink Joniqua Sidle PT, DPT   Kathryn Aguilar 11/19/2014, 11:50 AM

## 2014-11-19 NOTE — Op Note (Signed)
[facility] Gastroenterology Patient Name: Kathryn Aguilar Procedure Date: 11/19/2014 11:53 AM MRN: [id number] Account #: [id number] Date of Birth: 12-Jul-1925 Admit Type: Inpatient Age: 82 Room: [facility] ENDO ROOM 3 Gender: Female Note Status: Finalized Procedure:         Flexible Sigmoidoscopy Indications:       Hematochezia Patient Profile:   This is an 81 year old female. Providers:         Rhona Raider. Shelle Iron, MD Medicines:         None Complications:     No immediate complications. Procedure:         Pre-Anesthesia Assessment:                    - Prior to the procedure, a History and Physical was                     performed, and patient medications, allergies and                     sensitivities were reviewed. The patient's tolerance of                     previous anesthesia was reviewed.                    After obtaining informed consent, the scope was passed                     under direct vision. The Olympus GIF-160 endoscope (S#.                     [id number]) was introduced through the anus and advanced to                     the the sigmoid colon. The flexible sigmoidoscopy was                     accomplished without difficulty. The patient tolerated the                     procedure well. The quality of the bowel preparation was                     poor. Findings:      The perianal exam findings include non-thrombosed external hemorrhoids.      A continuous area of nonbleeding ulcerated mucosa with stigmata of       recent bleeding was present in the distal rectum. Single very small       bioposy was taken with a cold forceps for histology.      Solid stool was found in the rectum, interfering with visualization. Impression:        - Preparation of the colon was poor.                    - Non-thrombosed external hemorrhoids found on perianal                     exam.                    - Mucosal ulceration. Biopsied.                    - Stool in the  rectum.                    -  Suspect this is stercoral ulceration from impacted stool. Recommendation:    - Return patient to hospital ward for ongoing care.                    Tap water enema BID                    - Continue present medications. \n                    - Start miralax 17 gr BIDD                    - The findings and recommendations were discussed with the                     patient.                    - The findings and recommendations were discussed with the                     patient's family. Procedure Code(s): --- Professional ---                    [phone], Sigmoidoscopy, flexible; with biopsy, single or                     multiple CPT copyright 2014 American Medical Association. All rights reserved. The codes documented in this report are preliminary and upon coder review may  be revised to meet current compliance requirements. Kathalene Frames, MD 11/19/2014 12:17:23 PM This report has been signed electronically. Number of Addenda: 0 Note Initiated On: 11/19/2014 11:53 AM      [facility]

## 2014-11-19 NOTE — Progress Notes (Signed)
[facility] - [place] at [facility]   PATIENT NAME: Kathryn Aguilar    MR#:  [id number]  DATE OF BIRTH:  1925/08/13  SUBJECTIVE:  CHIEF COMPLAINT:   Chief Complaint  Patient presents with  . Rectal Bleeding   No further bleeding. Had flex sig earlier today. Patient doesn't remember  REVIEW OF SYSTEMS:    ROS  Unobtainable due to dementia  DRUG ALLERGIES:  No Known Allergies  VITALS:  Blood pressure 145/72, pulse 77, temperature 98.4 F (36.9 C), temperature source Oral, resp. rate 19, height  (1.676 m), weight 52.164 kg (115 lb), SpO2 100 %.  PHYSICAL EXAMINATION:   Physical Exam  GENERAL:  81 y.o.-year-old patient lying in the bed with no acute distress.  EYES: Pupils equal, round, reactive to light and accommodation. No scleral icterus. Extraocular muscles intact.  HEENT: Head atraumatic, normocephalic. Oropharynx and nasopharynx clear.  NECK:  Supple, no jugular venous distention. No thyroid enlargement, no tenderness.  LUNGS: Normal breath sounds bilaterally, no wheezing, rales, rhonchi. No use of accessory muscles of respiration.  CARDIOVASCULAR: S1, S2 normal. No murmurs, rubs, or gallops.  ABDOMEN: Soft, nontender, nondistended. Bowel sounds present. No organomegaly or mass.  EXTREMITIES: No cyanosis, clubbing or edema b/l.    NEUROLOGIC: Cranial nerves II through XII are intact. No focal Motor or sensory deficits b/l.   PSYCHIATRIC: The patient is alert , pleasantly confused SKIN: No obvious rash, lesion, or ulcer.    LABORATORY PANEL:   CBC  Recent Labs Lab 11/19/14 0438  WBC 6.0  HGB 11.7*  HCT 35.4  PLT 251   ------------------------------------------------------------------------------------------------------------------  Chemistries   Recent Labs Lab 11/18/14 0730 11/19/14 0438  NA 144 141  K 3.4* 3.2*  CL 105 103  CO2 29 30  GLUCOSE 121* 111*  BUN 37* 20  CREATININE 0.81 0.67  CALCIUM 9.1 8.1*  AST 38   --   ALT 35  --   ALKPHOS 148*  --   BILITOT 0.6  --    ------------------------------------------------------------------------------------------------------------------  Cardiac Enzymes  Recent Labs Lab 11/18/14 0730  TROPONINI <0.03   ------------------------------------------------------------------------------------------------------------------  RADIOLOGY:  Ct Abdomen Pelvis W Contrast  11/18/2014   CLINICAL DATA:  Rectal bleeding, history hypertension, dementia  EXAM: CT ABDOMEN AND PELVIS WITH CONTRAST  TECHNIQUE: Multidetector CT imaging of the abdomen and pelvis was performed using the standard protocol following bolus administration of intravenous contrast. Sagittal and coronal MPR images reconstructed from axial data set.  CONTRAST:  80mL OMNIPAQUE IOHEXOL 300 MG/ML SOLN IV. Dilute oral contrast.  COMPARISON:  None  FINDINGS: Calcified granuloma RIGHT lower lobe image 7.  Calcified splenic granulomata.  Gallbladder distended without gross wall thickening or calcification.  Tiny probable RIGHT renal cysts.  Liver, spleen, pancreas, kidneys, and adrenal glands otherwise normal.  Stomach and small bowel loops normal appearance.  Prominent stool in rectum and RIGHT colon.  Probable inferior rectal wall thickening.  Sigmoid diverticulosis without evidence of diverticulitis.  Appendix not definitely visualized.  Atrophic uterus with unremarkable adnexa.  Tiny amount of nonspecific fluid within vagina.  No other definite acute intra-abdominal or intrapelvic abnormalities.  Bladder and ureters unremarkable.  Small RIGHT adductor intramuscular lipoma.  Scattered atherosclerotic calcifications.  No mass, adenopathy, free fluid or free air.  Bones diffusely demineralized with old L3 inferior endplate compression fracture.  IMPRESSION: Increased stool in rectum with probable inferior rectal wall thickening suspicious for proctitis.  Sigmoid diverticulosis.  Old granulomatous disease.    Electronically Signed  By: Ulyses Southward M.D.   On: 11/18/2014 11:02     ASSESSMENT AND PLAN:   Patient is a 81 year old white female with history of dementia presents with bright red blood per rectum  1. Bright red blood per rectum: Likely from hemorrhoids and rectal ulcers from impacted stool. Monitor Hb Enema and miralax.  2. Hypertension continue Norvasc as taking at home  3. Dementia continue Aricept and Namenda as taking at home  4. Give her degeneration continue eyedrops  5. Chronic back pain due to impression fractures:pain medications PT evaluation   All the records are reviewed and case discussed with Care Management/Social Workerr. Management plans discussed with the patient, family and they are in agreement.  CODE STATUS: DNR/DNI  DVT Prophylaxis: SCDs  TOTAL TIME TAKING CARE OF THIS PATIENT: 30 minutes.  POSSIBLE D/C IN 1-2 DAYS, DEPENDING ON CLINICAL CONDITION and Hb stable.   Milagros Loll R M.D on 11/19/2014 at 1:53 PM  Between 7am to 6pm - Pager - [phone]  After 6pm go to [email or web address] - password [email or web address] [facility]  [place]  [facility]  Office  [phone]  CC: Primary care physician; Clydie Braun, MD

## 2014-11-19 NOTE — Progress Notes (Signed)
Patient took her bedtime medications with much coaxing, husband also talked her thru taking her medications. Patient told her husband to take her pills, she is confused and has difficulty putting sentence together at times. Patient is pleasantly confused.

## 2014-11-19 NOTE — Progress Notes (Signed)
Initial Nutrition Assessment  INTERVENTION:  Medical Food Supplement Therapy: will recommend Ensure once diet order able to advanced Ensure Enlive (each supplement provides 350kcal and 20 grams of protein)  NUTRITION DIAGNOSIS:  Inadequate oral intake related to inability to eat as evidenced by NPO status.  GOAL:   (Diet advancement as medically able within 5-7 days)  MONITOR:   (Energy Intake, Digestive System, Anthropometrics, Electrolyte and renal Profile)  REASON FOR ASSESSMENT:  Malnutrition Screening Tool    ASSESSMENT:  Pt admitted with lower GI bleed; flex sig today, per MD operative note likely stercoral ulcer secondary to impaction. Pt also with compression fracture of spine, no surgical intervention. PMHx:  Past Medical History  Diagnosis Date  . Compression fracture   . Dementia   . Hypertension   . Macular degeneration   . Hyperlipemia    Diet Order: NPO  Current Nutrition: Pt currently NPO  Food/Nutrition-Related History: Pt husband reports pt with decreased appetite since back pain started 2.5 weeks ago. Pt husband reports pt eating small amounts at meal times; no supplement drinks.   Medications: NS at 63mL/hr, Cipro, Flagyl, Miralax, tap water enema  Electrolyte/Renal Profile and Glucose Profile:   Recent Labs Lab 11/18/14 0730 11/19/14 0438  NA 144 141  K 3.4* 3.2*  CL 105 103  CO2 29 30  BUN 37* 20  CREATININE 0.81 0.67  CALCIUM 9.1 8.1*  GLUCOSE 121* 111*   Protein Profile:  Recent Labs Lab 11/18/14 0730  ALBUMIN 3.4*    Gastrointestinal Profile: Last BM: 6/24 this am per Nsg documentation bloody stool   Nutrition-Focused Physical Exam Findings:  Unable to complete Nutrition-Focused physical exam at this time.    Weight Change: Pt husband unsure of wieght trend on visit, reports thinking pt was 120lbs 'at one point' Anthropometrics: Height:  Ht Readings from Last 1 Encounters:  11/18/14 5\' 6"  (1.676 m)    Weight:  Wt Readings from Last 1 Encounters:  11/18/14 115 lb (52.164 kg)    Wt Readings from Last 10 Encounters:  11/18/14 115 lb (52.164 kg)  11/10/14 115 lb (52.164 kg)    BMI:  Body mass index is 18.57 kg/(m^2).  Estimated Nutritional Needs:  Kcal:  1293-1528kcals, BEE: 980kcals, TEE: (IF 1.1-1.3)(AF 1.2)  Protein:  52-62g protein (1.0-1.2g/kg)  Fluid:  1308-1562mL of fluid (25-106mL/kg)  Skin:  Reviewed, no issues   EDUCATION NEEDS:  Education needs no appropriate at this time   Intake/Output Summary (Last 24 hours) at 11/19/14 1236 Last data filed at 11/19/14 0457  Gross per 24 hour  Intake      0 ml  Output      0 ml  Net      0 ml    MODERATE Care Level  Leda Quail, RD, LDN Pager [phone]

## 2014-11-19 NOTE — Plan of Care (Signed)
Problem: Discharge Progression Outcomes Goal: Discharge plan in place and appropriate Outcome: Progressing Pt goes by Kathryn Aguilar. Lives with husband at [place] Hypertension , dementia, macular degeneration managed with home medications. Right hib lower back compression fracture: surggery is not an option at this time. Goal: Tolerating diet Outcome: Not Applicable Date Met:  51/83/35 Pt NPO  Goal: Other Discharge Outcomes/Goals Outcome: Progressing Plan of care Pt admitted yesterday from the ED. NPO except meds. No complaints of pain. Some agitation noted. MD ordered Haldol once with improvement. Pt removed her IV during her state of agitation and a new one was  replaced successfully. Incontinent of bowel and bladder, stools bloody. Hgb ordered q 8hrs and they have remained stable.

## 2014-11-20 LAB — HEMOGLOBIN: Hemoglobin: 10.8 g/dL — ABNORMAL LOW (ref 12.0–16.0)

## 2014-11-20 NOTE — Plan of Care (Signed)
Problem: Acute Rehab PT Goals(only PT should resolve) Goal: Pt Will Go Sit To Supine/Side Pt will transfer sit to/from-stand with RW at supervision without loss-of-balance to demonstrate good safety awareness for independent mobility in home.     Goal: Patient Will Transfer Sit To/From Stand Pt will transfer sit to/from-stand with RW at supervision without loss-of-balance to demonstrate good safety awareness for independent mobility in home.     Goal: Pt Will Ambulate Pt will ambulate with RW at Supervision using a step-through pattern and equal step length for a distances greater than 67ft to demonstrate the ability to perform safe household distance ambulation at discharge.

## 2014-11-20 NOTE — Progress Notes (Signed)
GI Inpatient Follow-up Note  Patient Identification: Kathryn Aguilar is a 81 y.o. female who is being evaluated for lower GI bleeding.   Subjective: Kathryn Aguilar had a flex sig on 11/19/2014 which found a rectal ulceration adjacent to hard stool . BID tap water enemas have been ordered.  Per nursing notes one enema  Created a small amount of stool and the second one gave a clear return.  Her husband was present and gave her history and reports that she has not complained of any abdominal pain or cramping. Scheduled Inpatient Medications:  . amLODipine  5 mg Oral Daily  . docusate sodium  100 mg Oral BID  . donepezil  10 mg Oral QHS  . latanoprost  1 drop Both Eyes QHS  . memantine  10 mg Oral BID  . nortriptyline  10 mg Oral Daily  . polyethylene glycol  17 g Oral BID    Continuous Inpatient Infusions:     PRN Inpatient Medications:  acetaminophen **OR** acetaminophen, oxyCODONE  Review of Systems: Constitutional: Weight is stable.  Eyes: No changes in vision. ENT: No oral lesions, sore throat.  GI: see HPI.  Heme/Lymph: No easy bruising.  CV: No chest pain.  GU: No hematuria.  Integumentary: No rashes.  Neuro: No headaches.  Psych: No depression/anxiety.  Endocrine: No heat/cold intolerance.  Allergic/Immunologic: No urticaria.  Resp: No cough, SOB.  Musculoskeletal: No joint swelling.    Physical Examination: BP 128/62 mmHg  Pulse 75  Temp(Src) 97.7 F (36.5 C) (Oral)  Resp 20  Ht 5\' 6"  (1.676 m)  Wt 52.164 kg (115 lb)  BMI 18.57 kg/m2  SpO2 100% Gen: NAD, history was given and confirmed with husband HEENT: PEERLA, EOMI, Neck: supple, no JVD or thyromegaly Chest: CTA bilaterally, no wheezes, crackles, or other adventitious sounds CV: RRR, no m/g/c/r Abd: soft, NT, ND, +BS in all four quadrants; no HSM, guarding, ridigity, or rebound tenderness Ext: no edema, well perfused with 2+ pulses, Skin: no rash or lesions noted Lymph: no LAD  Data: Lab Results   Component Value Date   WBC 6.0 11/19/2014   HGB 10.8* 11/20/2014   HCT 35.4 11/19/2014   MCV 88.3 11/19/2014   PLT 251 11/19/2014    Recent Labs Lab 11/19/14 1509 11/19/14 2038 11/20/14 0351  HGB 11.6* 11.4* 10.8*   Lab Results  Component Value Date   NA 141 11/19/2014   K 3.2* 11/19/2014   CL 103 11/19/2014   CO2 30 11/19/2014   BUN 20 11/19/2014   CREATININE 0.67 11/19/2014   Lab Results  Component Value Date   ALT 35 11/18/2014   AST 38 11/18/2014   ALKPHOS 148* 11/18/2014   BILITOT 0.6 11/18/2014   No results for input(s): APTT, INR, PTT in the last 168 hours. Assessment/Plan: Kathryn Aguilar is a 81 y.o. female with GI bleeding  Recommendations: We will continue to watch Kathryn Aguilar.  We agree with the orders in place for Colace BID, Miralax 17 g BID and a soft diet.  We agree with the BID tap water enemas.  We recommend continuing following the Hgb, since is trending down slightly.  We will continue to follow with you. Please call with questions or concerns.  Carney Harder, PA-C  I personally performed these services.

## 2014-11-20 NOTE — Evaluation (Signed)
Physical Therapy Evaluation Patient Details Name: Kathryn Aguilar MRN: [id number] DOB: July 29, 1925 Today's Date: 11/20/2014   History of Present Illness  Kathryn Aguilar is a 81 y.o. female with a known history ofDementia, who currently resides at the [facility] [place] [facility], careed for by her husband. He reports that she has had some semisolid stools over the past few days. PTA he was changing when he noticed that she had bright red blood all over her diapers. Therefore he decided to bring her to the hospital. Patient had a CT scan of the abdomen which showed Increased stool in rectum with probable inferior rectal wall thickening suspicious for proctitis. Patient has moderate dementia and is unable to provide me any history although history was obtained by the husband.  Clinical Impression  Pt is received semirecumbent in bed upon entry, awake, alert, and willing to participate. No acute distress noted, however pt immediately reporting that she needs to void and would like assistance to [facility]. Pt is A&O to person only. Pt is a poor historian, hence history is taken largely from husband at this time. Husband reports pt mobility has been largely indep, until onset of back pain several weeks ago. Pt has been getting PT for back at [facility], but with continued chronic pain and less active indep mobility.Pt strength as screened by functional mobility assessment presents with mild impairments, requiring MaxA for bed mobility, minA for transfers, and ModA for standing pivot transfers. Pt does not tolerate additional amb at this time due to poor pain control. Pt falls risk is high as evidenced by slow gait speed, poor forward reach, and altered posturing during transfers and mobility.Patient presenting with impairment of strength, range of motion, balance, and activity tolerance, limiting ability to perform ADL and mobility tasks at  baseline level of function. Patient will benefit from skilled  intervention to address the above impairments and limitations, in order to restore to prior level of function, improve patient safety upon discharge, and to decrease falls risk.       Follow Up Recommendations SNF (Husband prefers STR at [facility]. )    Equipment Recommendations  Rolling walker with 5" wheels    Recommendations for Other Services       Precautions / Restrictions Precautions Precautions: Fall Restrictions Weight Bearing Restrictions: No      Mobility  Bed Mobility Overal bed mobility: Needs Assistance Bed Mobility: Supine to Sit     Supine to sit: Max assist     General bed mobility comments: Limited by R LBP; requires help c trunk righting and scooting in bed.   Transfers Overall transfer level: Needs assistance Equipment used: None;1 person hand held assist Transfers: Sit to/from [employer] Sit to Stand: Min assist Stand pivot transfers: Min assist       General transfer comment: 1x sit to/from stand; 2x stand pivot transfer.  Ambulation/Gait Ambulation/Gait assistance: Mod assist   Assistive device: 1 person hand held assist Gait Pattern/deviations: Antalgic Gait velocity: guarded and painful      Stairs            Wheelchair Mobility    Modified Rankin (Stroke Patients Only)       Balance Overall balance assessment: No apparent balance deficits (not formally assessed)  Pertinent Vitals/Pain Pain Assessment: Faces Faces Pain Scale: Hurts even more Pain Location: R hip/pelvis  Pain Descriptors / Indicators:  (Unable to describe secondary to Dx dementia ) Pain Intervention(s): Limited activity within patient's tolerance;Monitored during session    Home Living Family/patient expects to be discharged to:: Private residence [facility] ) Living Arrangements: Spouse/significant other Available Help at Discharge: Family Type of  Home: Independent living facility Home Access: Level entry     Home Layout: One level Home Equipment: Environmental consultant - 2 wheels;Cane - single point      Prior Function Level of Independence: Needs assistance   Gait / Transfers Assistance Needed: Pt began to experience R hip/back pain several weeks PTA, which has greatly limited independenc ein mobility, bu tup until that point, pt was previously supervision with all mobility, tolerating community distance ambulation.   ADL's / Homemaking Assistance Needed: Requirs assistance.         Hand Dominance        Extremity/Trunk Assessment   Upper Extremity Assessment: Generalized weakness           Lower Extremity Assessment: Generalized weakness      Cervical / Trunk Assessment: Kyphotic (forward flexed posture.)  Communication   Communication: No difficulties  Cognition Arousal/Alertness: Awake/alert Behavior During Therapy: WFL for tasks assessed/performed (moderately confused, mildly anxious regarding pain. ) Overall Cognitive Status: History of cognitive impairments - at baseline Area of Impairment: Orientation;Memory;Problem solving Orientation Level: Person   Memory: Decreased short-term memory       Problem Solving: Slow processing      General Comments      Exercises        Assessment/Plan    PT Assessment Patient needs continued PT services  PT Diagnosis Difficulty walking;Generalized weakness;Acute pain   PT Problem List Decreased strength;Decreased cognition;Decreased activity tolerance  PT Treatment Interventions Gait training;Balance training;Functional mobility training;Therapeutic activities;Therapeutic exercise   PT Goals (Current goals can be found in the Care Plan section) Acute Rehab PT Goals Patient Stated Goal: Husband would prefer eventual return to home c patient once mobility needs are closer to PLOF.  PT Goal Formulation: Patient unable to participate in goal setting Time For Goal  Achievement: 12/04/14 Potential to Achieve Goals: Fair    Frequency Min 2X/week   Barriers to discharge   Subacute stable back pain has greatly diminished patient's ability to indep perform mobility. Husband is primary caregiver and unable to provide more than ModA for bed mob adn transfers.     Co-evaluation               End of Session Equipment Utilized During Treatment: Gait belt Activity Tolerance: Patient tolerated treatment well;Patient limited by pain Patient left: in bed;with nursing/sitter in room;with call bell/phone within reach;with bed alarm set;with family/visitor present Nurse Communication: Mobility status         Time: 1308-6578 PT Time Calculation (min) (ACUTE ONLY): 20 min   Charges:   PT Evaluation $Initial PT Evaluation Tier I: 1 Procedure     PT G Codes:        Buccola,Allan C 2014-11-29, 12:38 PM 12:49 PM  Rosamaria Lints, PT, DPT [place] License # [id number]

## 2014-11-20 NOTE — Plan of Care (Signed)
Problem: Discharge Progression Outcomes Goal: Discharge plan in place and appropriate Outcome: Progressing Care Plan Note: Pain: Patient denies any pain. Hemodynamics: Vital signs stable and WNL. Barriers: Patient had soap suds enema as ordered this am - mostly clear return with small amount of brown/red material. Activity: Patient seen by physical therapy today. Husband at bedside. No distress noted.

## 2014-11-20 NOTE — Clinical Social Work Placement (Signed)
   CLINICAL SOCIAL WORK PLACEMENT  NOTE  Date:  11/20/2014  Patient Details  Name: Kathryn Aguilar MRN: [id number] Date of Birth: Oct 28, 1925  Clinical Social Work is seeking post-discharge placement for this patient at the Skilled  Nursing Facility level of care (*CSW will initial, date and re-position this form in  chart as items are completed):  Yes   Patient/family provided with [place] Clinical Social Work Department's list of facilities offering this level of care within the geographic area requested by the patient (or if unable, by the patient's family).  Yes   Patient/family informed of their freedom to choose among providers that offer the needed level of care, that participate in Medicare, Medicaid or managed care program needed by the patient, have an available bed and are willing to accept the patient.  Yes   Patient/family informed of [place]'s ownership interest in [facility] and [facility], as well as of the fact that they are under no obligation to receive care at these facilities.  PASRR submitted to EDS on 11/20/14     PASRR number received on 11/20/14     Existing PASRR number confirmed on       FL2 transmitted to all facilities in geographic area requested by pt/family on 11/20/14     FL2 transmitted to all facilities within larger geographic area on       Patient informed that his/her managed care company has contracts with or will negotiate with certain facilities, including the following:            Patient/family informed of bed offers received.  Patient chooses bed at       Physician recommends and patient chooses bed at      Patient to be transferred to   on  .  Patient to be transferred to facility by       Patient family notified on   of transfer.  Name of family member notified:        PHYSICIAN Please sign FL2     Additional Comment:    _______________________________________________ Haig Prophet, LCSW 11/20/2014,  2:17 PM

## 2014-11-20 NOTE — Progress Notes (Signed)
0800 - Soap suds enema given: mostly clear return, only few small particles of red material noted.

## 2014-11-20 NOTE — Clinical Social Work Note (Signed)
Clinical Social Work Assessment  Patient Details  Name: Kathryn Aguilar MRN: [id number] Date of Birth: 08-Dec-1925  Date of referral:  11/20/14               Reason for consult:  Facility Placement                Permission sought to share information with:  Chartered certified accountant granted to share information::  Yes, Verbal Permission Granted  Name::      Retail buyer::   [place]   Relationship::     Contact Information:     Housing/Transportation Living arrangements for the past 2 months:  Charity fundraiser of Information:  Patient, Spouse Patient Interpreter Needed:  None Criminal Activity/Legal Involvement Pertinent to Current Situation/Hospitalization:  No - Comment as needed Significant Relationships:  Spouse Lives with:  Spouse, Other (Comment) (Independent living resident ) Do you feel safe going back to the place where you live?  Yes Need for family participation in patient care:  Yes (Comment)  Care giving concerns:  Patient and her husband are Independent Living Residents at [facility].    Social Worker assessment / plan:  Holiday representative (CSW) met with patient and her husband Senta Kantor was at bedside. Patient was laying in the bed and went to sleep several times during assessment. Patient's husband reported that him and his wife are Independent Living Residents at [facility]. They live in a duplex and the best number to reach husband is the home phone [phone]. Husband reported that he feels that patient needs to go to rehab because he can't take care of her with the internal bleeding. Husband reported that he prefers [facility]. CSW explained SNF process and provided support.   FL2 complete and faxed out to [facility].   Employment status:  Retired Nurse, adult PT Recommendations:  [place] / Referral to community resources:  [place]  Patient/Family's Response to care:  Patient and husband are agreeable to [employer] and prefer [employer].   Patient/Family's Understanding of and Emotional Response to Diagnosis, Current Treatment, and Prognosis:  Patient was laying in the bed and husband was standing beside the bed. Both patient and husband were pleasant and thanked CSW for visit and assisting with placement.   Emotional Assessment Appearance:  Appears stated age Attitude/Demeanor/Rapport:    Affect (typically observed):  Quiet, Pleasant Orientation:  Oriented to Self, Oriented to Place, Oriented to  Time Alcohol / Substance use:  Not Applicable Psych involvement (Current and /or in the community):  No (Comment)  Discharge Needs  Concerns to be addressed:  Discharge Planning Concerns Readmission within the last 30 days:  No Current discharge risk:  Cognitively Impaired Barriers to Discharge:  Continued Medical Work up   Loralyn Freshwater, LCSW 11/20/2014, 2:19 PM

## 2014-11-20 NOTE — Progress Notes (Signed)
[facility] - [place] at [facility]   PATIENT NAME: Kathryn Aguilar    MR#:  [id number]  DATE OF BIRTH:  06/26/1925  SUBJECTIVE:  CHIEF COMPLAINT:   Chief Complaint  Patient presents with  . Rectal Bleeding   No further bleeding. Some specks of blood in enema  REVIEW OF SYSTEMS:    ROS  Unobtainable due to dementia  DRUG ALLERGIES:  No Known Allergies  VITALS:  Blood pressure 140/65, pulse 78, temperature 98.4 F (36.9 C), temperature source Oral, resp. rate 18, height 5\' 6"  (1.676 m), weight 52.164 kg (115 lb), SpO2 100 %.  PHYSICAL EXAMINATION:   Physical Exam  GENERAL:  81 y.o.-year-old patient lying in the bed with no acute distress.  EYES: Pupils equal, round, reactive to light and accommodation. No scleral icterus. Extraocular muscles intact.  HEENT: Head atraumatic, normocephalic. Oropharynx and nasopharynx clear.  NECK:  Supple, no jugular venous distention. No thyroid enlargement, no tenderness.  LUNGS: Normal breath sounds bilaterally, no wheezing, rales, rhonchi. No use of accessory muscles of respiration.  CARDIOVASCULAR: S1, S2 normal. No murmurs, rubs, or gallops.  ABDOMEN: Soft, nontender, nondistended. Bowel sounds present. No organomegaly or mass.  EXTREMITIES: No cyanosis, clubbing or edema b/l.    NEUROLOGIC: Cranial nerves II through XII are intact. No focal Motor or sensory deficits b/l.   PSYCHIATRIC: The patient is alert , pleasantly confused SKIN: No obvious rash, lesion, or ulcer.    LABORATORY PANEL:   CBC  Recent Labs Lab 11/19/14 0438  11/20/14 0351  WBC 6.0  --   --   HGB 11.7*  < > 10.8*  HCT 35.4  --   --   PLT 251  --   --   < > = values in this interval not displayed. ------------------------------------------------------------------------------------------------------------------  Chemistries   Recent Labs Lab 11/18/14 0730 11/19/14 0438  NA 144 141  K 3.4* 3.2*  CL 105 103  CO2 29 30   GLUCOSE 121* 111*  BUN 37* 20  CREATININE 0.81 0.67  CALCIUM 9.1 8.1*  AST 38  --   ALT 35  --   ALKPHOS 148*  --   BILITOT 0.6  --    ------------------------------------------------------------------------------------------------------------------  Cardiac Enzymes  Recent Labs Lab 11/18/14 0730  TROPONINI <0.03   ------------------------------------------------------------------------------------------------------------------  RADIOLOGY:  No results found.   ASSESSMENT AND PLAN:   Patient is a 81 year old white female with history of dementia presents with bright red blood per rectum  1. Bright red blood per rectum: Likely from hemorrhoids and rectal ulcers from impacted stool. Monitor Hb Enema and miralax. Monitor HB Likely d/c in AM  2. Hypertension continue Norvasc as taking at home  3. Dementia continue Aricept and Namenda as taking at home  4. Give her degeneration continue eyedrops  5. Chronic back pain due to impression fractures:pain medications PT evaluation   All the records are reviewed and case discussed with Care Management/Social Workerr. Management plans discussed with the patient, family and they are in agreement.  CODE STATUS: DNR/DNI  DVT Prophylaxis: SCDs  TOTAL TIME TAKING CARE OF THIS PATIENT: 30 minutes.  POSSIBLE D/C IN 1-2 DAYS, DEPENDING ON CLINICAL CONDITION and Hb stable.   Milagros Loll R M.D on 11/20/2014 at 10:00 PM  Between 7am to 6pm - Pager - [phone]  After 6pm go to [email or web address] - password [email or web address] [facility]  [place] [facility]  Office  [phone]  CC: Primary care physician; Clydie Braun, MD

## 2014-11-21 LAB — HEMOGLOBIN: Hemoglobin: 11.8 g/dL — ABNORMAL LOW (ref 12.0–16.0)

## 2014-11-21 MED ORDER — SODIUM CHLORIDE 0.9 % IJ SOLN
3.0000 mL | Freq: Two times a day (BID) | INTRAMUSCULAR | Status: DC
  Administered 2014-11-21 – 2014-11-22 (×3): 3 mL via INTRAVENOUS

## 2014-11-21 NOTE — Plan of Care (Signed)
Problem: Discharge Progression Outcomes Goal: Other Discharge Outcomes/Goals Outcome: Progressing Plan of care progress to goals: Barriers:history of dementia and increased weakness; assist with ambulation; pt incontinent of bowel and bladder per report. Pain: Patient denies pain this shift.  Hemodynamics: VSS, latest hgb result 10.8. Diet:No c/o nausea or vomiting Activity: As tolerated with 1-2 assist.  Husband at bedside at start of shift.

## 2014-11-21 NOTE — Progress Notes (Signed)
Dr. Sheryle Hail notified that hgb drawn this morning at 0430 is up to 11.8, there is another hgb scheduled to be drawn this morning at 0630, order obtained to cancel hgb for 0630.

## 2014-11-21 NOTE — Plan of Care (Signed)
Problem: Discharge Progression Outcomes Goal: Barriers To Progression Addressed/Resolved Individualization: Outcome: Progressing Husband stays during the day providing support, assisting pt with meals. Goal: Other Discharge Outcomes/Goals Outcome: Progressing 1. No pain reported this shift. 2.Hbg up to 11.8 Gm with no sign/symptom bleeding. 3. Up to BSC/ assists with turns/repositioning in bed.

## 2014-11-21 NOTE — Consult Note (Signed)
Pt with rectal ulcer on flex sig, no further bleeding, hgb stable at 11.8,  VSS afebrile, chest clear, abdomen soft without palpable masses.  No new recommendations.

## 2014-11-21 NOTE — Progress Notes (Signed)
[facility] - [place] at [facility]   PATIENT NAME: Kathryn Aguilar    MR#:  [id number]  DATE OF BIRTH:  [birth year]  SUBJECTIVE:  CHIEF COMPLAINT:   Chief Complaint  Patient presents with  . Rectal Bleeding   No further bleeding. No concerns. Waiting for SNF bed  REVIEW OF SYSTEMS:    ROS  Unobtainable due to dementia  DRUG ALLERGIES:  No Known Allergies  VITALS:  Blood pressure 154/65, pulse 76, temperature 98.9 F (37.2 C), temperature source Oral, resp. rate 18, height 5\' 6"  (1.676 m), weight 52.164 kg (115 lb), SpO2 100 %.  PHYSICAL EXAMINATION:   Physical Exam  GENERAL:  81 y.o.-year-old patient lying in the bed with no acute distress.  EYES: Pupils equal, round, reactive to light and accommodation. No scleral icterus. Extraocular muscles intact.  HEENT: Head atraumatic, normocephalic. Oropharynx and nasopharynx clear.  NECK:  Supple, no jugular venous distention. No thyroid enlargement, no tenderness.  LUNGS: Normal breath sounds bilaterally, no wheezing, rales, rhonchi. No use of accessory muscles of respiration.  CARDIOVASCULAR: S1, S2 normal. No murmurs, rubs, or gallops.  ABDOMEN: Soft, nontender, nondistended. Bowel sounds present. No organomegaly or mass.  EXTREMITIES: No cyanosis, clubbing or edema b/l.    NEUROLOGIC: Cranial nerves II through XII are intact. No focal Motor or sensory deficits b/l.  Genralised weakness PSYCHIATRIC: The patient is alert , pleasantly confused SKIN: No obvious rash, lesion, or ulcer.    LABORATORY PANEL:   CBC  Recent Labs Lab 11/19/14 0438  11/21/14 0445  WBC 6.0  --   --   HGB 11.7*  < > 11.8*  HCT 35.4  --   --   PLT 251  --   --   < > = values in this interval not displayed. ------------------------------------------------------------------------------------------------------------------  Chemistries   Recent Labs Lab 11/18/14 0730 11/19/14 0438  NA 144 141  K 3.4* 3.2*  CL 105  103  CO2 29 30  GLUCOSE 121* 111*  BUN 37* 20  CREATININE 0.81 0.67  CALCIUM 9.1 8.1*  AST 38  --   ALT 35  --   ALKPHOS 148*  --   BILITOT 0.6  --    ------------------------------------------------------------------------------------------------------------------  Cardiac Enzymes  Recent Labs Lab 11/18/14 0730  TROPONINI <0.03   ------------------------------------------------------------------------------------------------------------------  RADIOLOGY:  No results found.   ASSESSMENT AND PLAN:   Patient is a 81 year old white female with history of dementia presents with bright red blood per rectum  1. Bright red blood per rectum: Likely from hemorrhoids and rectal ulcers from impacted stool. Stable Hb. No further bleeding. Stopped abx. Appreciate GI help  2. Hypertension continue Norvasc as taking at home  3. Dementia continue Aricept and Namenda as taking at home  4. Give her degeneration continue eyedrops  5. Chronic back pain due to impression fractures:pain medications PT evaluation   All the records are reviewed and case discussed with Care Management/Social Workerr. Management plans discussed with the patient, family and they are in agreement.  CODE STATUS: DNR/DNI  DVT Prophylaxis: SCDs  TOTAL TIME TAKING CARE OF THIS PATIENT: 30 minutes.  D/C to SNF when bed available   Milagros Loll R M.D on 11/21/2014 at 8:25 AM  Between 7am to 6pm - Pager - [phone]  After 6pm go to [email or web address] - password [email or web address] [facility]  [place] [facility]  Office  [phone]  CC: Primary care physician; Clydie Braun, MD

## 2014-11-22 ENCOUNTER — Encounter: Payer: Self-pay | Admitting: Gastroenterology

## 2014-11-22 LAB — SURGICAL PATHOLOGY

## 2014-11-22 LAB — HEMOGLOBIN: HEMOGLOBIN: 11.7 g/dL — AB (ref 12.0–16.0)

## 2014-11-22 MED ORDER — POLYETHYLENE GLYCOL 3350 17 G PO PACK: 17.0000 g | PACK | Freq: Every day | ORAL | Status: AC

## 2014-11-22 NOTE — Plan of Care (Addendum)
Problem: Discharge Progression Outcomes Goal: Other Discharge Outcomes/Goals Plan of care progress to goal: Hemodynamically:VSS this shift Activity: requires assistance bsc

## 2014-11-22 NOTE — Discharge Summary (Signed)
[facility] - [place] at [facility]   PATIENT NAME: Kathryn Aguilar    MR#:  [id number]  DATE OF BIRTH:  [birth year]  DATE OF ADMISSION:  11/18/2014 ADMITTING PHYSICIAN: Auburn Bilberry, MD  DATE OF DISCHARGE: 11/22/2014  PRIMARY CARE PHYSICIAN: Clydie Braun, MD    ADMISSION DIAGNOSIS:  Rectal bleeding [K62.5]  DISCHARGE DIAGNOSIS:  Active Problems:   Lower GI bleed   Rectal ulcer  SECONDARY DIAGNOSIS:   Past Medical History  Diagnosis Date  . Compression fracture   . Dementia   . Hypertension   . Macular degeneration   . Hyperlipemia     HOSPITAL COURSE:   1. Bright red blood per rectum: Likely from hemorrhoids and rectal ulcers from impacted stool. Stable Hb. No further bleeding. Stopped abx. Appreciate GI help Will d/c with stool softners- now having good BMs.  2. Hypertension continue Norvasc as taking at home  3. Dementia continue Aricept and Namenda as taking at home  4. Give her degeneration continue eyedrops  5. Chronic back pain due to impression fractures:pain medications PT evaluation   DISCHARGE CONDITIONS:  Stable.  CONSULTS OBTAINED:  Treatment Team:  Auburn Bilberry, MD Elnita Maxwell, MD  DRUG ALLERGIES:  No Known Allergies  DISCHARGE MEDICATIONS:   Current Discharge Medication List    START taking these medications   Details  polyethylene glycol (MIRALAX / GLYCOLAX) packet Take 17 g by mouth daily. Qty: 14 each, Refills: 0      CONTINUE these medications which have NOT CHANGED   Details  amLODipine (NORVASC) 5 MG tablet Take 5 mg by mouth daily.    aspirin EC 81 MG tablet Take 81 mg by mouth daily.    donepezil (ARICEPT) 10 MG tablet Take 10 mg by mouth at bedtime.    latanoprost (XALATAN) 0.005 % ophthalmic solution Place 1 drop into both eyes at bedtime.    memantine (NAMENDA) 10 MG tablet Take 10 mg by mouth 2 (two) times daily.    Multiple Vitamins-Minerals (CENTRAL-VITE PO) Take 1  tablet by mouth daily. At lunchtime    nortriptyline (PAMELOR) 10 MG capsule Take 10 mg by mouth daily.    oxyCODONE (OXY IR/ROXICODONE) 5 MG immediate release tablet Take 5-10 mg by mouth every 6 (six) hours as needed for severe pain.         DISCHARGE INSTRUCTIONS:    Follow with PMD in 1 week.  If you experience worsening of your admission symptoms, develop shortness of breath, life threatening emergency, suicidal or homicidal thoughts you must seek medical attention immediately by calling 911 or calling your MD immediately  if symptoms less severe.  You Must read complete instructions/literature along with all the possible adverse reactions/side effects for all the Medicines you take and that have been prescribed to you. Take any new Medicines after you have completely understood and accept all the possible adverse reactions/side effects.   Please note  You were cared for by a hospitalist during your hospital stay. If you have any questions about your discharge medications or the care you received while you were in the hospital after you are discharged, you can call the unit and asked to speak with the hospitalist on call if the hospitalist that took care of you is not available. Once you are discharged, your primary care physician will handle any further medical issues. Please note that NO REFILLS for any discharge medications will be authorized once you are discharged, as it is imperative that you return  to your primary care physician (or establish a relationship with a primary care physician if you do not have one) for your aftercare needs so that they can reassess your need for medications and monitor your lab values.  Today   CHIEF COMPLAINT:   Chief Complaint  Patient presents with  . Rectal Bleeding    HISTORY OF PRESENT ILLNESS:  Kathryn Aguilar  is a 81 y.o. female with a known history of Dementia, who currently resides at the [facility] facility who is  taken care of by her husband mainly. He reports that she has had some semisolid stools over the past few days. Today he was changing when he noticed that she had bright red blood all over her diapers. Therefore he decided to bring her to the hospital. Patient had a CT scan of the abdomen which showed Increased stool in rectum with probable inferior rectal wall thickening suspicious for proctitis. Patient has advanced dementia and is unable to provide me any history although history was obtained by the husband  VITAL SIGNS:  Blood pressure 139/65, pulse 86, temperature 98.5 F (36.9 C), temperature source Oral, resp. rate 19, height 5\' 6"  (1.676 m), weight 52.164 kg (115 lb), SpO2 98 %.  I/O:   Intake/Output Summary (Last 24 hours) at 11/22/14 1458 Last data filed at 11/22/14 1015  Gross per 24 hour  Intake    243 ml  Output      0 ml  Net    243 ml    PHYSICAL EXAMINATION:   GENERAL: 81 y.o.-year-old patient lying in the bed with no acute distress.  EYES: Pupils equal, round, reactive to light and accommodation. No scleral icterus. Extraocular muscles intact.  HEENT: Head atraumatic, normocephalic. Oropharynx and nasopharynx clear.  NECK: Supple, no jugular venous distention. No thyroid enlargement, no tenderness.  LUNGS: Normal breath sounds bilaterally, no wheezing, rales, rhonchi. No use of accessory muscles of respiration.  CARDIOVASCULAR: S1, S2 normal. No murmurs, rubs, or gallops.  ABDOMEN: Soft, nontender, nondistended. Bowel sounds present. No organomegaly or mass.  EXTREMITIES: No cyanosis, clubbing or edema b/l.  NEUROLOGIC: Cranial nerves II through XII are intact. No focal Motor or sensory deficits b/l. Genralised weakness PSYCHIATRIC: The patient is alert , pleasantly confused SKIN: No obvious rash, lesion, or ulcer.   DATA REVIEW:   CBC  Recent Labs Lab 11/19/14 0438  11/22/14 0442  WBC 6.0  --   --   HGB 11.7*  < > 11.7*  HCT 35.4  --   --   PLT  251  --   --   < > = values in this interval not displayed.  Chemistries   Recent Labs Lab 11/18/14 0730 11/19/14 0438  NA 144 141  K 3.4* 3.2*  CL 105 103  CO2 29 30  GLUCOSE 121* 111*  BUN 37* 20  CREATININE 0.81 0.67  CALCIUM 9.1 8.1*  AST 38  --   ALT 35  --   ALKPHOS 148*  --   BILITOT 0.6  --     Cardiac Enzymes  Recent Labs Lab 11/18/14 0730  TROPONINI <0.03    Microbiology Results  No results found for this or any previous visit.  Management plans discussed with the patient, family and they are in agreement.  CODE STATUS:     Code Status Orders        Start     Ordered   11/18/14 1537  Do not attempt resuscitation (DNR)  Continuous    Question Answer Comment  In the event of cardiac or respiratory ARREST Do not call a "code blue"   In the event of cardiac or respiratory ARREST Do not perform Intubation, CPR, defibrillation or ACLS   In the event of cardiac or respiratory ARREST Use medication by any route, position, wound care, and other measures to relive pain and suffering. May use oxygen, suction and manual treatment of airway obstruction as needed for comfort.      11/18/14 1536      TOTAL TIME TAKING CARE OF THIS PATIENT: 35 minutes.    Altamese Dilling M.D on 11/22/2014 at 2:58 PM  Between 7am to 6pm - Pager - [phone]  After 6pm go to [email or web address] - password [email or web address]  [place] [facility]  Office  [phone]  CC: Primary care physician; Clydie Braun, MD

## 2014-11-22 NOTE — Clinical Social Work Note (Signed)
Pt is ready for discharge today and will go to [facility]. Pt and husband are agreeable to discharge plan. Facility is ready for pt as discharge information has been received. RN will call report and EMS will provide transportation. CSW signing off as no further needs identified.   Dede QuerySarah Tannon Peerson, MSW, LCSW Clinical Social Worker  [phone]

## 2014-11-22 NOTE — Clinical Social Work Placement (Signed)
   CLINICAL SOCIAL WORK PLACEMENT  NOTE  Date:  11/22/2014  Patient Details  Name: Kathryn Aguilar MRN: [id number] Date of Birth: Jul 22, 1925  Clinical Social Work is seeking post-discharge placement for this patient at the Skilled  Nursing Facility level of care (*CSW will initial, date and re-position this form in  chart as items are completed):  Yes   Patient/family provided with [place] Clinical Social Work Department's list of facilities offering this level of care within the geographic area requested by the patient (or if unable, by the patient's family).  Yes   Patient/family informed of their freedom to choose among providers that offer the needed level of care, that participate in Medicare, Medicaid or managed care program needed by the patient, have an available bed and are willing to accept the patient.  Yes   Patient/family informed of [facility]'s ownership interest in [facility] and [facility], as well as of the fact that they are under no obligation to receive care at these facilities.  PASRR submitted to EDS on 11/20/14     PASRR number received on 11/20/14     Existing PASRR number confirmed on       FL2 transmitted to all facilities in geographic area requested by pt/family on 11/20/14     FL2 transmitted to all facilities within larger geographic area on       Patient informed that his/her managed care company has contracts with or will negotiate with certain facilities, including the following:        Yes   Patient/family informed of bed offers received.  Patient chooses bed at  [facility]([place]     Physician recommends and patient chooses bed at  [facility](S[facility])    Patient to be transferred to  [facility]([place]) on 11/22/14.  Patient to be transferred to facility by  [facility])     Patient family notified on 11/22/14 of transfer.  Name of family member notified:   (Husband)     PHYSICIAN Please sign FL2     Additional Comment:     _______________________________________________ Dede QuerySarah Zenora Karpel, LCSW 11/22/2014, 3:55 PM

## 2014-11-22 NOTE — Care Management (Signed)
Important Message  Patient Details  Name: Jeri CosMary A Matsumura MRN: [id number] Date of Birth: 03/29/1926   Medicare Important Message Given:  Yes-second notification given    Verita SchneidersKathy A Allmond 11/22/2014, 1:43 PM

## 2014-11-22 NOTE — Progress Notes (Signed)
Report called to Dewayne HatchAnn at [facility]. Bo McclintockBrewer,Maleaha Hughett S, RN

## 2014-11-22 NOTE — Progress Notes (Signed)
EMS called for transport to [facility]. Bo McclintockBrewer,Phillips Goulette S, RN

## 2014-11-22 NOTE — Progress Notes (Signed)
Patient transported to [facility] with EMS. Husband will be transporting patient belongings. Bo Mcclintock, RN

## 2014-11-22 NOTE — Progress Notes (Signed)
Spoke with Verdon Cummins, [facility] rep at [phone], to notify of non-emergent EMS transport.  Auth notification reference given as [id number].   Service date range good from 11/22/14 - 02/20/15.   Gap exception requested to determine if services can be considered at an in-network level.

## 2014-11-23 DIAGNOSIS — G309 Alzheimer's disease, unspecified: Secondary | ICD-10-CM | POA: Diagnosis not present

## 2014-11-23 DIAGNOSIS — S22008A Other fracture of unspecified thoracic vertebra, initial encounter for closed fracture: Secondary | ICD-10-CM | POA: Diagnosis not present

## 2014-11-23 DIAGNOSIS — K625 Hemorrhage of anus and rectum: Secondary | ICD-10-CM | POA: Diagnosis not present

## 2014-11-23 DIAGNOSIS — I10 Essential (primary) hypertension: Secondary | ICD-10-CM | POA: Diagnosis not present

## 2014-12-09 DIAGNOSIS — S32009A Unspecified fracture of unspecified lumbar vertebra, initial encounter for closed fracture: Secondary | ICD-10-CM | POA: Diagnosis not present

## 2014-12-09 DIAGNOSIS — G309 Alzheimer's disease, unspecified: Secondary | ICD-10-CM | POA: Diagnosis not present

## 2014-12-09 DIAGNOSIS — K922 Gastrointestinal hemorrhage, unspecified: Secondary | ICD-10-CM | POA: Diagnosis not present

## 2014-12-09 DIAGNOSIS — I10 Essential (primary) hypertension: Secondary | ICD-10-CM | POA: Diagnosis not present

## 2015-01-12 DIAGNOSIS — E1165 Type 2 diabetes mellitus with hyperglycemia: Secondary | ICD-10-CM | POA: Diagnosis not present

## 2015-01-12 DIAGNOSIS — K625 Hemorrhage of anus and rectum: Secondary | ICD-10-CM | POA: Diagnosis not present

## 2015-01-12 DIAGNOSIS — E785 Hyperlipidemia, unspecified: Secondary | ICD-10-CM | POA: Diagnosis not present

## 2015-01-12 DIAGNOSIS — I10 Essential (primary) hypertension: Secondary | ICD-10-CM | POA: Diagnosis not present

## 2015-01-12 DIAGNOSIS — G309 Alzheimer's disease, unspecified: Secondary | ICD-10-CM

## 2015-02-08 DIAGNOSIS — I10 Essential (primary) hypertension: Secondary | ICD-10-CM | POA: Diagnosis not present

## 2015-02-08 DIAGNOSIS — G309 Alzheimer's disease, unspecified: Secondary | ICD-10-CM | POA: Diagnosis not present

## 2015-02-08 DIAGNOSIS — S32000A Wedge compression fracture of unspecified lumbar vertebra, initial encounter for closed fracture: Secondary | ICD-10-CM | POA: Diagnosis not present

## 2015-04-20 DIAGNOSIS — G309 Alzheimer's disease, unspecified: Secondary | ICD-10-CM | POA: Diagnosis not present

## 2015-04-20 DIAGNOSIS — I10 Essential (primary) hypertension: Secondary | ICD-10-CM | POA: Diagnosis not present

## 2015-04-20 DIAGNOSIS — S32000S Wedge compression fracture of unspecified lumbar vertebra, sequela: Secondary | ICD-10-CM | POA: Diagnosis not present

## 2015-05-12 DIAGNOSIS — G301 Alzheimer's disease with late onset: Secondary | ICD-10-CM | POA: Diagnosis not present

## 2015-05-12 DIAGNOSIS — I10 Essential (primary) hypertension: Secondary | ICD-10-CM | POA: Diagnosis not present

## 2015-05-12 DIAGNOSIS — M159 Polyosteoarthritis, unspecified: Secondary | ICD-10-CM | POA: Diagnosis not present

## 2015-07-01 DIAGNOSIS — I10 Essential (primary) hypertension: Secondary | ICD-10-CM

## 2015-07-01 DIAGNOSIS — S32000S Wedge compression fracture of unspecified lumbar vertebra, sequela: Secondary | ICD-10-CM | POA: Diagnosis not present

## 2015-07-01 DIAGNOSIS — G309 Alzheimer's disease, unspecified: Secondary | ICD-10-CM

## 2015-07-01 DIAGNOSIS — M1990 Unspecified osteoarthritis, unspecified site: Secondary | ICD-10-CM | POA: Diagnosis not present

## 2015-07-20 ENCOUNTER — Emergency Department: Payer: Medicare Other

## 2015-07-20 ENCOUNTER — Inpatient Hospital Stay
Admission: EM | Admit: 2015-07-20 | Discharge: 2015-07-22 | DRG: 871 | Disposition: A | Discharge status: Skilled Nursing Facility | Payer: Medicare Other | Attending: Internal Medicine | Admitting: Internal Medicine

## 2015-07-20 ENCOUNTER — Encounter: Payer: Self-pay | Admitting: Emergency Medicine

## 2015-07-20 DIAGNOSIS — E785 Hyperlipidemia, unspecified: Secondary | ICD-10-CM | POA: Diagnosis present

## 2015-07-20 DIAGNOSIS — E860 Dehydration: Secondary | ICD-10-CM | POA: Diagnosis present

## 2015-07-20 DIAGNOSIS — G9340 Encephalopathy, unspecified: Secondary | ICD-10-CM | POA: Diagnosis present

## 2015-07-20 DIAGNOSIS — J189 Pneumonia, unspecified organism: Secondary | ICD-10-CM | POA: Diagnosis present

## 2015-07-20 DIAGNOSIS — Z85828 Personal history of other malignant neoplasm of skin: Secondary | ICD-10-CM

## 2015-07-20 DIAGNOSIS — R197 Diarrhea, unspecified: Secondary | ICD-10-CM | POA: Diagnosis present

## 2015-07-20 DIAGNOSIS — A419 Sepsis, unspecified organism: Principal | ICD-10-CM

## 2015-07-20 DIAGNOSIS — F0390 Unspecified dementia without behavioral disturbance: Secondary | ICD-10-CM | POA: Diagnosis present

## 2015-07-20 DIAGNOSIS — R509 Fever, unspecified: Secondary | ICD-10-CM | POA: Diagnosis present

## 2015-07-20 DIAGNOSIS — Z79899 Other long term (current) drug therapy: Secondary | ICD-10-CM

## 2015-07-20 DIAGNOSIS — H3530 Unspecified macular degeneration: Secondary | ICD-10-CM | POA: Diagnosis present

## 2015-07-20 DIAGNOSIS — I119 Hypertensive heart disease without heart failure: Secondary | ICD-10-CM | POA: Diagnosis present

## 2015-07-20 DIAGNOSIS — R5383 Other fatigue: Secondary | ICD-10-CM

## 2015-07-20 DIAGNOSIS — E876 Hypokalemia: Secondary | ICD-10-CM | POA: Diagnosis present

## 2015-07-20 DIAGNOSIS — B309 Viral conjunctivitis, unspecified: Secondary | ICD-10-CM | POA: Diagnosis present

## 2015-07-20 LAB — CBC WITH DIFFERENTIAL/PLATELET
Basophils Absolute: 0 10*3/uL (ref 0–0.1)
Basophils Relative: 0 %
Eosinophils Absolute: 0 10*3/uL (ref 0–0.7)
Eosinophils Relative: 0 %
HEMATOCRIT: 38.4 % (ref 35.0–47.0)
HEMOGLOBIN: 12.8 g/dL (ref 12.0–16.0)
LYMPHS ABS: 1.3 10*3/uL (ref 1.0–3.6)
Lymphocytes Relative: 14 %
MCH: 29.4 pg (ref 26.0–34.0)
MCHC: 33.2 g/dL (ref 32.0–36.0)
MCV: 88.5 fL (ref 80.0–100.0)
MONO ABS: 0.5 10*3/uL (ref 0.2–0.9)
MONOS PCT: 5 %
NEUTROS ABS: 7.7 10*3/uL — AB (ref 1.4–6.5)
NEUTROS PCT: 81 %
Platelets: 206 10*3/uL (ref 150–440)
RBC: 4.34 MIL/uL (ref 3.80–5.20)
RDW: 14.8 % — AB (ref 11.5–14.5)
WBC: 9.5 10*3/uL (ref 3.6–11.0)

## 2015-07-20 LAB — URINALYSIS COMPLETE WITH MICROSCOPIC (ARMC ONLY)
BACTERIA UA: NONE SEEN
BILIRUBIN URINE: NEGATIVE
Glucose, UA: NEGATIVE mg/dL
HGB URINE DIPSTICK: NEGATIVE
Ketones, ur: NEGATIVE mg/dL
LEUKOCYTES UA: NEGATIVE
NITRITE: NEGATIVE
PH: 7 (ref 5.0–8.0)
PROTEIN: NEGATIVE mg/dL
SQUAMOUS EPITHELIAL / LPF: NONE SEEN
Specific Gravity, Urine: 1.011 (ref 1.005–1.030)

## 2015-07-20 LAB — COMPREHENSIVE METABOLIC PANEL
ALBUMIN: 3.9 g/dL (ref 3.5–5.0)
ALT: 17 U/L (ref 14–54)
AST: 23 U/L (ref 15–41)
Alkaline Phosphatase: 102 U/L (ref 38–126)
Anion gap: 9 (ref 5–15)
BILIRUBIN TOTAL: 0.5 mg/dL (ref 0.3–1.2)
BUN: 25 mg/dL — AB (ref 6–20)
CHLORIDE: 104 mmol/L (ref 101–111)
CO2: 26 mmol/L (ref 22–32)
CREATININE: 0.68 mg/dL (ref 0.44–1.00)
Calcium: 9.2 mg/dL (ref 8.9–10.3)
GFR calc Af Amer: >60 mL/min (ref >60)
GLUCOSE: 124 mg/dL — AB (ref 65–99)
POTASSIUM: 4.1 mmol/L (ref 3.5–5.1)
Sodium: 139 mmol/L (ref 135–145)
Total Protein: 7.2 g/dL (ref 6.5–8.1)

## 2015-07-20 LAB — GLUCOSE, CSF: Glucose, CSF: 75 mg/dL — ABNORMAL HIGH (ref 40–70)

## 2015-07-20 LAB — PROTIME-INR
INR: 0.96
PROTHROMBIN TIME: 13 s (ref 11.4–15.0)

## 2015-07-20 LAB — TROPONIN I: Troponin I: <0.03 ng/mL (ref <0.031)

## 2015-07-20 LAB — APTT: aPTT: 26 seconds (ref 24–36)

## 2015-07-20 LAB — LIPASE, BLOOD: Lipase: 42 U/L (ref 11–51)

## 2015-07-20 LAB — LACTIC ACID, PLASMA
LACTIC ACID, VENOUS: 0.9 mmol/L (ref 0.5–2.0)
LACTIC ACID, VENOUS: 1.9 mmol/L (ref 0.5–2.0)

## 2015-07-20 LAB — RAPID INFLUENZA A&B ANTIGENS: Influenza B (ARMC): NOT DETECTED

## 2015-07-20 LAB — PROTEIN, CSF: Total  Protein, CSF: 54 mg/dL — ABNORMAL HIGH (ref 15–45)

## 2015-07-20 LAB — RAPID INFLUENZA A&B ANTIGENS (ARMC ONLY): INFLUENZA A (ARMC): NOT DETECTED

## 2015-07-20 MED ORDER — FLEET ENEMA 7-19 GM/118ML RE ENEM: 1.0000 | ENEMA | Freq: Once | RECTAL | Status: DC | PRN

## 2015-07-20 MED ORDER — VANCOMYCIN HCL IN DEXTROSE 1-5 GM/200ML-% IV SOLN
1000.0000 mg | Freq: Once | INTRAVENOUS | Status: AC
  Administered 2015-07-20: 1000 mg via INTRAVENOUS
  Filled 2015-07-20: qty 200

## 2015-07-20 MED ORDER — LIDOCAINE-EPINEPHRINE (PF) 2 %-1:200000 IJ SOLN: 10.0000 mL | Freq: Once | INTRAMUSCULAR | Status: DC

## 2015-07-20 MED ORDER — IPRATROPIUM-ALBUTEROL 0.5-2.5 (3) MG/3ML IN SOLN: 3.0000 mL | Freq: Four times a day (QID) | RESPIRATORY_TRACT | Status: DC | PRN

## 2015-07-20 MED ORDER — ONDANSETRON HCL 4 MG PO TABS: 4.0000 mg | ORAL_TABLET | Freq: Four times a day (QID) | ORAL | Status: DC | PRN

## 2015-07-20 MED ORDER — ONDANSETRON HCL 4 MG/2ML IJ SOLN: 4.0000 mg | Freq: Four times a day (QID) | INTRAMUSCULAR | Status: DC | PRN

## 2015-07-20 MED ORDER — PIPERACILLIN-TAZOBACTAM 3.375 G IVPB 30 MIN
3.3750 g | Freq: Once | INTRAVENOUS | Status: AC
  Administered 2015-07-20: 3.375 g via INTRAVENOUS
  Filled 2015-07-20: qty 50

## 2015-07-20 MED ORDER — SODIUM CHLORIDE 0.9 % IV SOLN
2.0000 g | INTRAVENOUS | Status: DC
  Administered 2015-07-21 (×4): 2 g via INTRAVENOUS
  Filled 2015-07-20 (×11): qty 2000

## 2015-07-20 MED ORDER — MORPHINE SULFATE (PF) 2 MG/ML IV SOLN
2.0000 mg | INTRAVENOUS | Status: DC | PRN
  Administered 2015-07-21: 2 mg via INTRAVENOUS
  Filled 2015-07-20: qty 1

## 2015-07-20 MED ORDER — LIDOCAINE-EPINEPHRINE (PF) 1 %-1:200000 IJ SOLN
INTRAMUSCULAR | Status: AC
  Administered 2015-07-20: 30 mL via INTRADERMAL
  Filled 2015-07-20: qty 30

## 2015-07-20 MED ORDER — DEXTROSE 5 % IV SOLN
2.0000 g | Freq: Two times a day (BID) | INTRAVENOUS | Status: DC
  Administered 2015-07-21 (×2): 2 g via INTRAVENOUS
  Filled 2015-07-20 (×4): qty 2

## 2015-07-20 MED ORDER — LIDOCAINE-EPINEPHRINE (PF) 1 %-1:200000 IJ SOLN
10.0000 mL | Freq: Once | INTRAMUSCULAR | Status: AC
  Administered 2015-07-20: 30 mL via INTRADERMAL

## 2015-07-20 MED ORDER — ACETAMINOPHEN 650 MG RE SUPP: 650.0000 mg | RECTAL | Status: DC | PRN

## 2015-07-20 MED ORDER — BISACODYL 10 MG RE SUPP: 10.0000 mg | Freq: Every day | RECTAL | Status: DC | PRN

## 2015-07-20 MED ORDER — SODIUM CHLORIDE 0.9 % IV BOLUS (SEPSIS)
1000.0000 mL | INTRAVENOUS | Status: AC
  Administered 2015-07-20: 1000 mL via INTRAVENOUS

## 2015-07-20 MED ORDER — ENOXAPARIN SODIUM 40 MG/0.4ML ~~LOC~~ SOLN
40.0000 mg | SUBCUTANEOUS | Status: DC
  Administered 2015-07-21: 40 mg via SUBCUTANEOUS
  Filled 2015-07-20 (×2): qty 0.4

## 2015-07-20 MED ORDER — POLYETHYLENE GLYCOL 3350 17 G PO PACK: 17.0000 g | PACK | Freq: Every day | ORAL | Status: DC | PRN

## 2015-07-20 NOTE — ED Notes (Addendum)
Antibiotic infusing by previous RN. 2nd Blood culture set needs to be collected at this time.

## 2015-07-20 NOTE — ED Notes (Signed)
Pt via ems from [facility] with fever today of 102.9. They gave her 1000 tylenol suppository. Pt sleepy but responds to voice and manual stimulation.

## 2015-07-20 NOTE — ED Notes (Signed)
Pt brief changed and rectal temp taken. Pt tolerated well. NAD noted.

## 2015-07-20 NOTE — ED Provider Notes (Signed)
[facility] Emergency Department Provider Note  ____________________________________________  Time seen: 515 pm  I have reviewed the triage vital signs and the nursing notes.   HISTORY  Chief Complaint Fever  Level 5 caveat:  Portions of the history and physical were unable to be obtained due to the patient's acute illness and chronic dementia   HPI Kathryn Aguilar is a 82 y.o. female brought to the ED from [facility] primary care performed fever to 102.9 today as well as lethargy. She is given 1000 g Tylenol suppository earlier today with control of her fever. Patient is unable to provide any history at this time.  There is no report from nursing home by EMS of any vomiting diarrhea or specific pain complaints.     Past Medical History  Diagnosis Date  . Compression fracture   . Dementia   . Hypertension   . Macular degeneration   . Hyperlipemia      Patient Active Problem List   Diagnosis Date Noted  . Lower GI bleed 11/18/2014     Past Surgical History  Procedure Laterality Date  . Cataract extraction    . Skin cancer excision    . Flexible sigmoidoscopy N/A 11/19/2014    Procedure: FLEXIBLE SIGMOIDOSCOPY;  Surgeon: Elnita Maxwell, MD;  Location: [facility] ENDOSCOPY;  Service: Endoscopy;  Laterality: N/A;     Current Outpatient Rx  Name  Route  Sig  Dispense  Refill  . acetaminophen (TYLENOL) 325 MG tablet   Oral   Take 650 mg by mouth 3 (three) times daily.         Marland Kitchen acetaminophen (TYLENOL) 500 MG tablet   Oral   Take 500 mg by mouth 2 (two) times daily as needed for mild pain.         Marland Kitchen acetaminophen (TYLENOL) 650 MG suppository   Rectal   Place 650 mg rectally every 4 (four) hours as needed for fever.         Marland Kitchen aluminum hydroxide (DERMAGRAN) ointment   Topical   Apply 1 application topically 3 (three) times daily.         Marland Kitchen amLODipine (NORVASC) 2.5 MG tablet   Oral   Take 2.5 mg by mouth daily.         Marland Kitchen  donepezil (ARICEPT) 10 MG tablet   Oral   Take 10 mg by mouth at bedtime.         Marland Kitchen latanoprost (XALATAN) 0.005 % ophthalmic solution   Both Eyes   Place 1 drop into both eyes at bedtime.         . memantine (NAMENDA) 10 MG tablet   Oral   Take 10 mg by mouth 2 (two) times daily.         . Multiple Vitamins-Minerals (ICAPS AREDS 2 PO)   Oral   Take 1 each by mouth 2 (two) times daily.         . polyethylene glycol (MIRALAX / GLYCOLAX) packet   Oral   Take 17 g by mouth daily.   14 each   0   . polyvinyl alcohol (LIQUIFILM TEARS) 1.4 % ophthalmic solution   Both Eyes   Place 1 drop into both eyes 4 (four) times daily.         . Vitamin D, Ergocalciferol, (DRISDOL) 50000 units CAPS capsule   Oral   Take 50,000 Units by mouth every 30 (thirty) days. Pt takes on the 30th of every month.  Allergies Review of patient's allergies indicates no known allergies.   Family History  Problem Relation Age of Onset  . Heart disease Mother   . Stomach cancer Father     Social History Social History  Substance Use Topics  . Smoking status: Never Smoker   . Smokeless tobacco: None  . Alcohol Use: No    Review of Systems Unable to obtain due to acute illness and dementia ____________________________________________   PHYSICAL EXAM:  VITAL SIGNS: ED Triage Vitals  Enc Vitals Group     BP --      Pulse Rate 07/20/15 1721 97     Resp 07/20/15 1721 18     Temp 07/20/15 1721 99.6 F (37.6 C)     Temp Source 07/20/15 1721 Oral     SpO2 07/20/15 1721 94 %     Weight 07/20/15 1721 115 lb (52.164 kg)     Height 07/20/15 1721  (1.651 m)     Head Cir --      Peak Flow --      Pain Score --      Pain Loc --      Pain Edu? --      Excl. in GC? --     Vital signs reviewed, nursing assessments reviewed.   Constitutional:   Somnolent, briefly arousable. Ill-appearing Eyes:   No scleral icterus. No conjunctival pallor. PERRL. EOMI ENT    Head:   Normocephalic and atraumatic.   Nose:   No congestion/rhinnorhea. No septal hematoma   Mouth/Throat:   Unable to assess oropharynx due to patient inability to allow exam    Neck:   No stridor. No SubQ emphysema. No meningismus. Hematological/Lymphatic/Immunilogical:   No cervical lymphadenopathy. Cardiovascular:   RRR. Symmetric bilateral radial and DP pulses.  No murmurs.  Respiratory:   Normal respiratory effort without tachypnea nor retractions. Breath sounds are clear and equal bilaterally. No wheezes/rales/rhonchi. Gastrointestinal:   Soft and nontender. Non distended. There is no CVA tenderness.  No rebound, rigidity, or guarding. Genitourinary:   deferred Musculoskeletal:   Nontender with normal range of motion in all extremities. No joint effusions.  No lower extremity tenderness.  No edema. Neurologic:   Normal speech and language.  CN 2-10 normal. Motor grossly intact. No gross focal neurologic deficits are appreciated.  Skin:    Skin is warm, dry and intact. No rash noted.  No petechiae, purpura, or bullae. Psychiatric:   Mood and affect are normal. ____________________________________________    LABS (pertinent positives/negatives) (all labs ordered are listed, but only abnormal results are displayed) Labs Reviewed  COMPREHENSIVE METABOLIC PANEL - Abnormal; Notable for the following:    Glucose, Bld 124 (*)    BUN 25 (*)    All other components within normal limits  CBC WITH DIFFERENTIAL/PLATELET - Abnormal; Notable for the following:    RDW 14.8 (*)    Neutro Abs 7.7 (*)    All other components within normal limits  URINALYSIS COMPLETEWITH MICROSCOPIC ([facility] ONLY) - Abnormal; Notable for the following:    Color, Urine STRAW (*)    APPearance CLEAR (*)    All other components within normal limits  CULTURE, BLOOD (ROUTINE X 2)  CULTURE, BLOOD (ROUTINE X 2)  CULTURE, EXPECTORATED SPUTUM-ASSESSMENT  URINE CULTURE  CSF CULTURE  RAPID INFLUENZA A&B  ANTIGENS ([facility] ONLY)  LACTIC ACID, PLASMA  LACTIC ACID, PLASMA  LIPASE, BLOOD  TROPONIN I  APTT  PROTIME-INR  CSF CELL COUNT WITH DIFFERENTIAL  CSF CELL  COUNT WITH DIFFERENTIAL  GLUCOSE, CSF  PROTEIN, CSF   ____________________________________________   EKG    ____________________________________________    RADIOLOGY  Chest x-ray unremarkable CT head unremarkable  ____________________________________________   PROCEDURES LUMBAR PUNCTURE  Date/Time: 07/20/2015 at 10:11 PM Performed by: Sharman Cheek  Consent: Verbal consent obtained. Written consent obtained. Risks and benefits: risks, benefits and alternatives were discussed Consent given by: son at bedside.   Patient understanding: patient states understanding of the procedure being performed  Patient consent: the patient's understanding of the procedure matches consent given  Procedure consent: procedure consent matches procedure scheduled  Relevant documents: relevant documents present and verified  Test results: test results available and properly labeled Site marked: the operative site was marked Imaging studies: imaging studies available  Required items: required blood products, implants, devices, and special equipment available  Patient identity confirmed: verbally with patient and arm band  Time out: Immediately prior to procedure a "time out" was called to verify the correct patient, procedure, equipment, support staff and site/side marked as required.  Indications: fever, altered mental status Anesthesia: local infiltration Local anesthetic: lidocaine 1% without epinephrine Anesthetic total: 2 ml Patient sedated: no Analgesia: local  Preparation: Patient was prepped and draped in the usual sterile fashion. Lumbar space: L3-L4 interspace Patient's position: right lateral decubitus Needle gauge: 20 Needle length: 3.5 in Number of attempts: 2 Fluid appearance: clear Tubes of fluid: 4 Total  volume: 6 ml Post-procedure: site cleaned and adhesive bandage applied Patient tolerance: Patient tolerated the procedure well with no immediate complications   CRITICAL CARE Performed by: Scotty Court, Samik Balkcom   Total critical care time: 35 minutes  Critical care time was exclusive of separately billable procedures and treating other patients.  Critical care was necessary to treat or prevent imminent or life-threatening deterioration.  Critical care was time spent personally by me on the following activities: development of treatment plan with patient and/or surrogate as well as nursing, discussions with consultants, evaluation of patient's response to treatment, examination of patient, obtaining history from patient or surrogate, ordering and performing treatments and interventions, ordering and review of laboratory studies, ordering and review of radiographic studies, pulse oximetry and re-evaluation of patient's condition.  ____________________________________________   INITIAL IMPRESSION / ASSESSMENT AND PLAN / ED COURSE  Pertinent labs & imaging results that were available during my care of the patient were reviewed by me and considered in my medical decision making (see chart for details).  Patient resents a fever altered mental status and tachycardia. Could sepsis initiated after initial evaluation. IV fluids and empiric vancomycin and Zosyn.  ----------------------------------------- 10:18 PM on 07/20/2015 -----------------------------------------  Initial workup essentially unremarkable does not reveal source. Patient is not in shock and vital signs remained stable. Fever is been controlled by the initial Tylenol that she was given suppository. Patient still remained poorly responsive so a lumbar puncture was performed. We'll follow up CSF results. Case was discussed with the hospitalist for admission. Suspected sepsis related to meningitis or encephalitis..flu swab sent for  cohorting as well.  Care and results discussed with the son at bedside.   ____________________________________________   FINAL CLINICAL IMPRESSION(S) / ED DIAGNOSES  Final diagnoses:  Fever, unspecified fever cause  Chronic dementia, without behavioral disturbance  Lethargy  Sepsis, due to unspecified organism [facility])      Sharman Cheek, MD 07/20/15 2219

## 2015-07-20 NOTE — H&P (Signed)
[facility] [place] [facility]   PATIENT NAME: Kathryn Aguilar    MR#:  [id number]  DATE OF BIRTH:  05-13-1926  DATE OF ADMISSION:  07/20/2015  PRIMARY CARE PHYSICIAN: Clydie Braun, MD   REQUESTING/REFERRING PHYSICIAN: Dr. Scotty Court  CHIEF COMPLAINT:   Chief Complaint  Patient presents with  . Fever    HISTORY OF PRESENT ILLNESS:  Kathryn Aguilar  is a 82 y.o. female with a known history of dementia, hypertension presents to the emergency room from [facility] due to fever and drowsiness. Patient was noticed to have fever 102.9 brought to the emergency room after giving her Tylenol suppository. Here her temperature is 99. Chest x-ray is clear. Urinalysis showed no UTI. Lumbar puncture has been done and sample sent. CSF analysis results are pending. No recent antibiotic use. History obtained from old records and discussing with son at bedside. At baseline patient can carry some conversations although is confused most or times. Ambulates with a walker with difficulty. Her husband is in independent living area off and Lasix. Presently he is admitted here to the hospital with C. Difficile. Patient has had a cold. No vomiting or diarrhea or abdominal pain.  PAST MEDICAL HISTORY:   Past Medical History  Diagnosis Date  . Compression fracture   . Dementia   . Hypertension   . Macular degeneration   . Hyperlipemia     PAST SURGICAL HISTORY:   Past Surgical History  Procedure Laterality Date  . Cataract extraction    . Skin cancer excision    . Flexible sigmoidoscopy N/A 11/19/2014    Procedure: FLEXIBLE SIGMOIDOSCOPY;  Surgeon: Elnita Maxwell, MD;  Location: [facility] ENDOSCOPY;  Service: Endoscopy;  Laterality: N/A;    SOCIAL HISTORY:   Social History  Substance Use Topics  . Smoking status: Never Smoker   . Smokeless tobacco: Not on file  . Alcohol Use: No    FAMILY HISTORY:   Family History  Problem Relation Age  of Onset  . Heart disease Mother   . Stomach cancer Father     DRUG ALLERGIES:  No Known Allergies  REVIEW OF SYSTEMS:   Review of Systems  Unable to perform ROS: mental status change    MEDICATIONS AT HOME:   Prior to Admission medications   Medication Sig Start Date End Date Taking? Authorizing Provider  acetaminophen (TYLENOL) 325 MG tablet Take 650 mg by mouth 3 (three) times daily.   Yes Historical Provider, MD  acetaminophen (TYLENOL) 500 MG tablet Take 500 mg by mouth 2 (two) times daily as needed for mild pain.   Yes Historical Provider, MD  acetaminophen (TYLENOL) 650 MG suppository Place 650 mg rectally every 4 (four) hours as needed for fever.   Yes Historical Provider, MD  aluminum hydroxide [facility]) ointment Apply 1 application topically 3 (three) times daily.   Yes Historical Provider, MD  amLODipine (NORVASC) 2.5 MG tablet Take 2.5 mg by mouth daily.   Yes Historical Provider, MD  donepezil (ARICEPT) 10 MG tablet Take 10 mg by mouth at bedtime.   Yes Historical Provider, MD  latanoprost (XALATAN) 0.005 % ophthalmic solution Place 1 drop into both eyes at bedtime.   Yes Historical Provider, MD  memantine (NAMENDA) 10 MG tablet Take 10 mg by mouth 2 (two) times daily.   Yes Historical Provider, MD  Multiple Vitamins-Minerals (ICAPS AREDS 2 PO) Take 1 each by mouth 2 (two) times daily.   Yes Historical Provider, MD  polyethylene glycol (MIRALAX / GLYCOLAX) packet Take 17 g by mouth daily. 11/22/14  Yes Altamese Dilling, MD  polyvinyl alcohol (LIQUIFILM TEARS) 1.4 % ophthalmic solution Place 1 drop into both eyes 4 (four) times daily.   Yes Historical Provider, MD  Vitamin D, Ergocalciferol, (DRISDOL) 50000 units CAPS capsule Take 50,000 Units by mouth every 30 (thirty) days. Pt takes on the 30th of every month.   Yes Historical Provider, MD     VITAL SIGNS:  Blood pressure 145/80, pulse 87, temperature 98.6 F (37 C), temperature source Rectal, resp. rate 17,  height  (1.651 m), weight 52.164 kg (115 lb), SpO2 98 %.  PHYSICAL EXAMINATION:  Physical Exam  GENERAL:  82 y.o.-year-old patient lying in the bed with no acute distress.  EYES: Pupils equal, round, reactive to light and accommodation. No scleral icterus. Extraocular muscles intact.  HEENT: Head atraumatic, normocephalic. Oropharynx and nasopharynx clear. No oropharyngeal erythema, moist oral mucosa  NECK:  Supple, no jugular venous distention. No thyroid enlargement, no tenderness.  LUNGS: Normal breath sounds bilaterally, no wheezing, rales, rhonchi. No use of accessory muscles of respiration.  CARDIOVASCULAR: S1, S2 normal. No murmurs, rubs, or gallops.  ABDOMEN: Soft, nontender, nondistended. Bowel sounds present. No organomegaly or mass.  EXTREMITIES: No pedal edema, cyanosis, or clubbing. + 2 pedal & radial pulses b/l.   NEUROLOGIC: Follows some commands. Moves all 4 extremity's equally. PSYCHIATRIC: The patient is drowsy. Nonverbal SKIN: No obvious rash, lesion, or ulcer.   LABORATORY PANEL:   CBC  Recent Labs Lab 07/20/15 1755  WBC 9.5  HGB 12.8  HCT 38.4  PLT 206   ------------------------------------------------------------------------------------------------------------------  Chemistries   Recent Labs Lab 07/20/15 1755  NA 139  K 4.1  CL 104  CO2 26  GLUCOSE 124*  BUN 25*  CREATININE 0.68  CALCIUM 9.2  AST 23  ALT 17  ALKPHOS 102  BILITOT 0.5   ------------------------------------------------------------------------------------------------------------------  Cardiac Enzymes  Recent Labs Lab 07/20/15 1755  TROPONINI <0.03   ------------------------------------------------------------------------------------------------------------------  RADIOLOGY:  Ct Head Wo Contrast  07/20/2015  CLINICAL DATA:  Pt via ems from [facility] with fever today of 102.9. They gave her 1000 tylenol suppository. Pt sleepy but responds to voice and  manual stimulation. EXAM: CT HEAD WITHOUT CONTRAST TECHNIQUE: Contiguous axial images were obtained from the base of the skull through the vertex without intravenous contrast. COMPARISON:  Head CT 09/17/2012 FINDINGS: No acute intracranial hemorrhage. No focal mass lesion. No CT evidence of acute infarction. No midline shift or mass effect. No hydrocephalus. Basilar cisterns are patent. There is extensive periventricular white matter hypodensities. Extensive cortical atrophy and proportional ventricular dilatation. Paranasal sinuses and  mastoid air cells are clear. IMPRESSION: 1. No acute intracranial findings. 2. Extensive periventricular subcortical white matter hypodensities consistent small vessel ischemia. Not significant changed from prior MRI. 3. Generalized atrophy and ventricular dilatation is also similar Electronically Signed   By: Genevive Bi M.D.   On: 07/20/2015 18:32   Dg Chest Port 1 View  07/20/2015  CLINICAL DATA:  Fever today.  Drowsiness.  Initial encounter. EXAM: PORTABLE CHEST 1 VIEW COMPARISON:  None. FINDINGS: The lungs are clear. Heart size is normal. No pneumothorax or pleural effusion. IMPRESSION: No acute disease. Electronically Signed   By: Drusilla Kanner M.D.   On: 07/20/2015 18:19     IMPRESSION AND PLAN:   * Fever with acute encephalopathy Lumbar puncture has been done. Labs pending. Protein is elevated at 54. We will start  patient on ceftriaxone, vancomycin, ampicillin and acyclovir. Antibiotics can be changed depending on CSF results. CT scan of the head showed nothing acute. Blood cultures pending. CSF cell count, glucose, protein, HSV PCR, Culture sent.  * Hypertension Patient is nothing by mouth due to acute encephalopathy. Use IV when necessary medications.  * Dementia Watch For inpatient delirium  * DVT prophylaxis with Lovenox.  All the records are reviewed and case discussed with ED provider. Management plans discussed with the patient, family  and they are in agreement.  CODE STATUS: FULL  TOTAL TIME TAKING CARE OF THIS PATIENT: 40 minutes.   Milagros Loll R M.D on 07/20/2015 at 11:55 PM  Between 7am to 6pm - Pager - [phone]  After 6pm go to [email or web address] - password [email or web address] [facility]  [place] [facility]  Office  [phone]  CC: Primary care physician; Clydie Braun, MD  Note: This dictation was prepared with Dragon dictation along with smaller phrase technology. Any transcriptional errors that result from this process are unintentional.

## 2015-07-20 NOTE — ED Notes (Signed)
2nd blood culture set collected by this RN. IV fluids infusing at this time.

## 2015-07-20 NOTE — Consult Note (Addendum)
Pharmacy Antibiotic Note  Kathryn Aguilar is a 82 y.o. female admitted on 07/20/2015 with meningitis.  Pharmacy has been consulted for vancomycin and acyclovir dosing.  Plan: TBW 52.2kg  IBW 57kg  DW 52.2kg  Vd 37L kei 0.038 hr-1  T1/2 18 hours Patient received 1 g of vancomycin in ED. Start  16 hours after intial dose. Level before 5th dose. Goal trough 15-20.  Acyclovir 10 mg/kg q 12 hours ordered. Patient is also on ampicillin and ceftriaxone.  Height:  (165.1 cm) Weight: 115 lb (52.164 kg) IBW/kg (Calculated) : 57  Temp (24hrs), Avg:99.1 F (37.3 C), Min:98.6 F (37 C), Max:99.6 F (37.6 C)   Recent Labs Lab 07/20/15 1722 07/20/15 1755  WBC  --  9.5  CREATININE  --  0.68  LATICACIDVEN 0.9  --     Estimated Creatinine Clearance: 40.8 mL/min (by C-G formula based on Cr of 0.68).    No Known Allergies  Antimicrobials this admission: Vancomycin 2/22 >>  zosyn 2/22 >>   Dose adjustments this admission:   Microbiology results: 2/22 BCx: collected   Thank you for allowing pharmacy to be a part of this patient's care.  Olene Floss 07/20/2015 7:44 PM

## 2015-07-21 LAB — CSF CELL COUNT WITH DIFFERENTIAL
MONOCYTE-MACROPHAGE-SPINAL FLUID: 100 % — AB (ref 15–45)
RBC Count, CSF: 3 /mm3 (ref 0–3)
RBC Count, CSF: 9 /mm3 — ABNORMAL HIGH (ref 0–3)
TUBE #: 4
Tube #: 1
WBC CSF: 3 /mm3
WBC, CSF: 0 /mm3

## 2015-07-21 LAB — CBC
HEMATOCRIT: 36.5 % (ref 35.0–47.0)
Hemoglobin: 12.4 g/dL (ref 12.0–16.0)
MCH: 29.9 pg (ref 26.0–34.0)
MCHC: 34 g/dL (ref 32.0–36.0)
MCV: 88.1 fL (ref 80.0–100.0)
Platelets: 199 10*3/uL (ref 150–440)
RBC: 4.15 MIL/uL (ref 3.80–5.20)
RDW: 14.9 % — AB (ref 11.5–14.5)
WBC: 7.5 10*3/uL (ref 3.6–11.0)

## 2015-07-21 LAB — BASIC METABOLIC PANEL
ANION GAP: 7 (ref 5–15)
BUN: 16 mg/dL (ref 6–20)
CO2: 28 mmol/L (ref 22–32)
Calcium: 8.6 mg/dL — ABNORMAL LOW (ref 8.9–10.3)
Chloride: 104 mmol/L (ref 101–111)
Creatinine, Ser: 0.53 mg/dL (ref 0.44–1.00)
GFR calc Af Amer: >60 mL/min (ref >60)
Glucose, Bld: 93 mg/dL (ref 65–99)
POTASSIUM: 3.4 mmol/L — AB (ref 3.5–5.1)
Sodium: 139 mmol/L (ref 135–145)

## 2015-07-21 MED ORDER — HYDRALAZINE HCL 20 MG/ML IJ SOLN: 10.0000 mg | Freq: Four times a day (QID) | INTRAMUSCULAR | Status: DC | PRN

## 2015-07-21 MED ORDER — POLYETHYLENE GLYCOL 3350 17 G PO PACK
17.0000 g | PACK | Freq: Every day | ORAL | Status: DC
  Administered 2015-07-22: 17 g via ORAL
  Filled 2015-07-21: qty 1

## 2015-07-21 MED ORDER — VANCOMYCIN HCL IN DEXTROSE 1-5 GM/200ML-% IV SOLN
1000.0000 mg | INTRAVENOUS | Status: DC
  Administered 2015-07-21: 1000 mg via INTRAVENOUS
  Filled 2015-07-21 (×2): qty 200

## 2015-07-21 MED ORDER — AMLODIPINE BESYLATE 5 MG PO TABS
2.5000 mg | ORAL_TABLET | Freq: Every day | ORAL | Status: DC
  Administered 2015-07-21 – 2015-07-22 (×2): 2.5 mg via ORAL
  Filled 2015-07-21 (×2): qty 1

## 2015-07-21 MED ORDER — PIPERACILLIN-TAZOBACTAM 3.375 G IVPB: 3.3750 g | Freq: Three times a day (TID) | INTRAVENOUS | Status: DC

## 2015-07-21 MED ORDER — ICAPS AREDS 2 PO CAPS: ORAL_CAPSULE | Freq: Two times a day (BID) | ORAL | Status: DC

## 2015-07-21 MED ORDER — OCUVITE-LUTEIN PO CAPS
1.0000 | ORAL_CAPSULE | Freq: Every day | ORAL | Status: DC
  Administered 2015-07-21 – 2015-07-22 (×2): 1 via ORAL
  Filled 2015-07-21 (×2): qty 1

## 2015-07-21 MED ORDER — LATANOPROST 0.005 % OP SOLN
1.0000 [drp] | Freq: Every day | OPHTHALMIC | Status: DC
  Administered 2015-07-21: 1 [drp] via OPHTHALMIC
  Filled 2015-07-21: qty 2.5

## 2015-07-21 MED ORDER — POLYVINYL ALCOHOL 1.4 % OP SOLN
1.0000 [drp] | Freq: Four times a day (QID) | OPHTHALMIC | Status: DC
  Administered 2015-07-21 – 2015-07-22 (×5): 1 [drp] via OPHTHALMIC
  Filled 2015-07-21: qty 15

## 2015-07-21 MED ORDER — VANCOMYCIN HCL IN DEXTROSE 750-5 MG/150ML-% IV SOLN: 750.0000 mg | INTRAVENOUS | Status: DC

## 2015-07-21 MED ORDER — DONEPEZIL HCL 5 MG PO TABS
10.0000 mg | ORAL_TABLET | Freq: Every day | ORAL | Status: DC
Start: 2015-07-21 — End: 2015-07-22
  Administered 2015-07-21: 10 mg via ORAL
  Filled 2015-07-21: qty 2

## 2015-07-21 MED ORDER — MEMANTINE HCL 10 MG PO TABS
10.0000 mg | ORAL_TABLET | Freq: Two times a day (BID) | ORAL | Status: DC
Start: 2015-07-21 — End: 2015-07-22
  Administered 2015-07-21 – 2015-07-22 (×3): 10 mg via ORAL
  Filled 2015-07-21 (×3): qty 1

## 2015-07-21 MED ORDER — DEXTROSE 5 % IV SOLN
10.0000 mg/kg | Freq: Two times a day (BID) | INTRAVENOUS | Status: DC
  Administered 2015-07-21 (×2): 520 mg via INTRAVENOUS
  Filled 2015-07-21 (×4): qty 10.4

## 2015-07-21 NOTE — Progress Notes (Signed)
Initial Nutrition Assessment   INTERVENTION:   Meals and Snacks: Cater to patient preferences; SLP following Medical Food Supplement Therapy: will recommend Mighty Shakes on meal trays TID and Magic Cup BID for added nutrition (each supplement provides approximately 300kcals and 9g protein)   NUTRITION DIAGNOSIS:   Inadequate oral intake related to acute illness as evidenced by per patient/family report.  GOAL:   Patient will meet greater than or equal to 90% of their needs  MONITOR:    (Energy Intake, electrolyte and renal Profile, Anthropometrics, Digestive system)  REASON FOR ASSESSMENT:   Consult Poor PO  ASSESSMENT:   Per MD note: Kathryn Aguilar is a 82 y.o. female with a known history of dementia, hypertension presents to the emergency room from [facility] due to fever and drowsiness. Pt s/p Lumbar puncture. Pt on airborne isolation today.  Past Medical History  Diagnosis Date  . Compression fracture   . Dementia   . Hypertension   . Macular degeneration   . Hyperlipemia      Diet Order:  DIET - DYS 1 Room service appropriate?: No; Fluid consistency:: Thin    Current Nutrition: Bites and sips of lunch tray eaten at bedside and with SLP per report  Food/Nutrition-Related History: Pt confused, unable to assess. Coincidentally, RD visited husband earlier who did comment that he in fact drank some of any Ensure that wife left.    Scheduled Medications:  . amLODipine  2.5 mg Oral Daily  . donepezil  10 mg Oral QHS  . enoxaparin (LOVENOX) injection  40 mg Subcutaneous Q24H  . latanoprost  1 drop Both Eyes QHS  . memantine  10 mg Oral BID  . multivitamin-lutein  1 capsule Oral Daily  . polyethylene glycol  17 g Oral Daily  . polyvinyl alcohol  1 drop Both Eyes QID    Electrolyte/Renal Profile and Glucose Profile:   Recent Labs Lab 07/20/15 1755 07/21/15 0715  NA 139 139  K 4.1 3.4*  CL 104 104  CO2 26 28  BUN 25* 16   CREATININE 0.68 0.53  CALCIUM 9.2 8.6*  GLUCOSE 124* 93   Protein Profile:  Recent Labs Lab 07/20/15 1755  ALBUMIN 3.9    Gastrointestinal Profile: Last BM: unknown   Nutrition-Focused Physical Exam Findings:  Unable to complete Nutrition-Focused physical exam at this time.    Weight Change: Per CHL weight weight stable.    Skin:  Reviewed, no issues   Height:   Ht Readings from Last 1 Encounters:  07/20/15  (1.651 m)    Weight:   Wt Readings from Last 1 Encounters:  07/20/15 115 lb (52.164 kg)   Wt Readings from Last 10 Encounters:  07/20/15 115 lb (52.164 kg)  11/18/14 115 lb (52.164 kg)  11/10/14 115 lb (52.164 kg)    BMI:  Body mass index is 19.14 kg/(m^2).  Estimated Nutritional Needs:   Kcal:  BEE: 1004kcals, TEE: (IF 1.1-1.3)(AF 1.2) 1325-1567kcals  Protein:  57-68g protein (1.0-1.2g/kg)  Fluid:  1420-1766mL of fluid (25-60mL/kg)  EDUCATION NEEDS:   No education needs identified at this time   HIGH Care Level  Leda Quail, RD, LDN Pager [phone] Weekend/On-Call Pager [phone]

## 2015-07-21 NOTE — Clinical Social Work Note (Signed)
Clinical Social Work Assessment  Patient Details  Name: Kathryn Aguilar MRN: [id number] Date of Birth: [birth year]  Date of referral:  07/21/15               Reason for consult:  Facility Placement                Permission sought to share information with:  Family Supports, Oceanographer granted to share information::  Yes, Verbal Permission Granted  Name::        Agency::     Relationship::     Contact Information:     Housing/Transportation Living arrangements for the past 2 months:  Assisted Living Facility (memory care) Source of Information:  Spouse Patient Interpreter Needed:  None Criminal Activity/Legal Involvement Pertinent to Current Situation/Hospitalization:  No - Comment as needed Significant Relationships:  Spouse Lives with:  Facility Resident Do you feel safe going back to the place where you live?  Yes Need for family participation in patient care:  Yes (Comment)  Care giving concerns:  Patient resides in memory care at [facility].   Social Worker assessment / plan:  Patient's husband is also currently a patient here at the hospital. CSW spoke with patient's husband who stated that he had not been feeling well for a few days and had stopped going to visit with his wife a few days ago because he did not want to get her sick. He stated that he knew that his wife had to be hospitalized because her fever went up so high. Patient's husband states that he does want patient to return to memory care at [facility] if possible. FL2 begun and in epic but not sent for signature yet.   Employment status:  Retired Database administrator PT Recommendations:  Not assessed at this time Information / Referral to community resources:     Patient/Family's Response to care:  Patient's husband expressed appreciation for CSW visit.  Patient/Family's Understanding of and Emotional Response to Diagnosis, Current Treatment, and Prognosis:   Patient's husband stated that his son has come into town to assist with both him and patient and that he is hoping patient can return to memory care but is also aware that depending on how weak she is, may need the healthcare building at [facility]. CSW will continue to follow.  Emotional Assessment Appearance:  Appears stated age Attitude/Demeanor/Rapport:   (pleasant and confused) Affect (typically observed):    Orientation:  Oriented to Self Alcohol / Substance use:  Not Applicable Psych involvement (Current and /or in the community):  No (Comment)  Discharge Needs  Concerns to be addressed:  Care Coordination Readmission within the last 30 days:  No Current discharge risk:  None Barriers to Discharge:  No Barriers Identified   Kathryn Spaniel, LCSW 07/21/2015, 4:15 PM

## 2015-07-21 NOTE — Progress Notes (Signed)
Pt. Lumbar tap came back as 100% monocytes. Dr. Sheryle Hail notified and ordered for pt. Negative pressure room and ID consult. Pt. On droplet precautions at this time.

## 2015-07-21 NOTE — Progress Notes (Signed)
Report called to [facility] on 2C. Orderly called to transfer patient.

## 2015-07-21 NOTE — Consult Note (Signed)
[facility] Infectious Disease     Reason for Consult: Fever, confusion     Referring Physician: Boykin Reaper Date of Admission:  07/20/2015   Active Problems:   Sepsis ([place])   HPI: Kathryn Aguilar is a 82 y.o. female with dementia, htn resident at [facility] admitted with fever to 102.9 and AMS.  Her husband who is my patient was admitted with dehydration yesterday after a viral illness.  She had LP done with only 3 wbc all monocytes. Has been on IV vanco, ctx, amp and acyclovir No fevers, wbc nml. [facility] ne. CXR CT head negative Seems back to baseline. No further fevers  Past Medical History  Diagnosis Date  . Compression fracture   . Dementia   . Hypertension   . Macular degeneration   . Hyperlipemia    Past Surgical History  Procedure Laterality Date  . Cataract extraction    . Skin cancer excision    . Flexible sigmoidoscopy N/A 11/19/2014    Procedure: FLEXIBLE SIGMOIDOSCOPY;  Surgeon: Josefine Class, MD;  Location: [facility] ENDOSCOPY;  Service: Endoscopy;  Laterality: N/A;   Social History  Substance Use Topics  . Smoking status: Never Smoker   . Smokeless tobacco: None  . Alcohol Use: No   Family History  Problem Relation Age of Onset  . Heart disease Mother   . Stomach cancer Father     Allergies: No Known Allergies  Current antibiotics: Antibiotics Given (last 72 hours)    Date/Time Action Medication Dose Rate   07/21/15 0224 Given   acyclovir (ZOVIRAX) 520 mg in dextrose 5 % 100 mL IVPB 520 mg 110.4 mL/hr   07/21/15 1157 Given  [not available at scheduled time]   acyclovir (ZOVIRAX) 520 mg in dextrose 5 % 100 mL IVPB 520 mg 110.4 mL/hr   07/21/15 1311 Given  [Pt only has one IV access site; and many IV meds to be given previously]   vancomycin (VANCOCIN) IVPB 1000 mg/200 mL premix 1,000 mg 200 mL/hr      MEDICATIONS: . acyclovir  10 mg/kg Intravenous Q12H  . amLODipine  2.5 mg Oral Daily  . ampicillin (OMNIPEN) IV  2 g Intravenous 6 times per day   . cefTRIAXone (ROCEPHIN)  IV  2 g Intravenous Q12H  . donepezil  10 mg Oral QHS  . enoxaparin (LOVENOX) injection  40 mg Subcutaneous Q24H  . latanoprost  1 drop Both Eyes QHS  . memantine  10 mg Oral BID  . multivitamin-lutein  1 capsule Oral Daily  . polyethylene glycol  17 g Oral Daily  . polyvinyl alcohol  1 drop Both Eyes QID  . vancomycin  1,000 mg Intravenous Q24H    Review of Systems - unable to obtain OBJECTIVE: Temp:  [98 F (36.7 C)-99.6 F (37.6 C)] 98.8 F (37.1 C) (02/23 1328) Pulse Rate:  [75-98] 84 (02/23 1328) Resp:  [15-20] 19 (02/23 1328) BP: (137-170)/(66-91) 143/69 mmHg (02/23 1328) SpO2:  [94 %-99 %] 96 % (02/23 1328) Weight:  [52.164 kg (115 lb)] 52.164 kg (115 lb) (02/22 1721) Physical Exam  Constitutional:  Advanced dementia, awake, interactive,nad HENT: [place]/AT, PERRLA, no scleral icterus + crusting Mouth/Throat: Oropharynx is clear and moist. No oropharyngeal exudate.  Cardiovascular: Normal rate, regular rhythm and normal heart sounds. Pulmonary/Chest: Effort normal and breath sounds normal. No respiratory distress.  has no wheezes.  Neck  supple, no nuchal rigidity Abdominal: Soft. Bowel sounds are normal.  exhibits no distension. There is no tenderness.  Lymphadenopathy: no cervical adenopathy. No axillary adenopathy Neurological: advanced dementia,cannot tell me where she is or even her husbands name.  Skin: Skin is warm and dry. No rash noted. No erythema.  Psychiatric: pleasant LABS: Results for orders placed or performed during the hospital encounter of 07/20/15 (from the past 48 hour(s))  Lactic acid, plasma     Status: None   Collection Time: 07/20/15  5:22 PM  Result Value Ref Range   Lactic Acid, Venous 0.9 0.5 - 2.0 mmol/L  Urinalysis complete, with microscopic ([facility] only)     Status: Abnormal   Collection Time: 07/20/15  5:54 PM  Result Value Ref Range   Color, Urine STRAW (A) YELLOW   APPearance CLEAR (A) CLEAR   Glucose, UA  NEGATIVE NEGATIVE mg/dL   Bilirubin Urine NEGATIVE NEGATIVE   Ketones, ur NEGATIVE NEGATIVE mg/dL   Specific Gravity, Urine 1.011 1.005 - 1.030   Hgb urine dipstick NEGATIVE NEGATIVE   pH 7.0 5.0 - 8.0   Protein, ur NEGATIVE NEGATIVE mg/dL   Nitrite NEGATIVE NEGATIVE   Leukocytes, UA NEGATIVE NEGATIVE   RBC / HPF 6-30 0 - 5 RBC/hpf   WBC, UA 0-5 0 - 5 WBC/hpf   Bacteria, UA NONE SEEN NONE SEEN   Squamous Epithelial / LPF NONE SEEN NONE SEEN  Comprehensive metabolic panel     Status: Abnormal   Collection Time: 07/20/15  5:55 PM  Result Value Ref Range   Sodium 139 135 - 145 mmol/L   Potassium 4.1 3.5 - 5.1 mmol/L   Chloride 104 101 - 111 mmol/L   CO2 26 22 - 32 mmol/L   Glucose, Bld 124 (H) 65 - 99 mg/dL   BUN 25 (H) 6 - 20 mg/dL   Creatinine, Ser 0.68 0.44 - 1.00 mg/dL   Calcium 9.2 8.9 - 10.3 mg/dL   Total Protein 7.2 6.5 - 8.1 g/dL   Albumin 3.9 3.5 - 5.0 g/dL   AST 23 15 - 41 U/L   ALT 17 14 - 54 U/L   Alkaline Phosphatase 102 38 - 126 U/L   Total Bilirubin 0.5 0.3 - 1.2 mg/dL   GFR calc non Af Amer >60 >60 mL/min   GFR calc Af Amer >60 >60 mL/min    Comment: (NOTE) The eGFR has been calculated using the CKD EPI equation. This calculation has not been validated in all clinical situations. eGFR's persistently <60 mL/min signify possible Chronic Kidney Disease.    Anion gap 9 5 - 15  Lipase, blood     Status: None   Collection Time: 07/20/15  5:55 PM  Result Value Ref Range   Lipase 42 11 - 51 U/L  Troponin I     Status: None   Collection Time: 07/20/15  5:55 PM  Result Value Ref Range   Troponin I <0.03 <0.031 ng/mL    Comment:        NO INDICATION OF MYOCARDIAL INJURY.   CBC WITH DIFFERENTIAL     Status: Abnormal   Collection Time: 07/20/15  5:55 PM  Result Value Ref Range   WBC 9.5 3.6 - 11.0 K/uL   RBC 4.34 3.80 - 5.20 MIL/uL   Hemoglobin 12.8 12.0 - 16.0 g/dL   HCT 38.4 35.0 - 47.0 %   MCV 88.5 80.0 - 100.0 fL   MCH 29.4 26.0 - 34.0 pg   MCHC 33.2  32.0 - 36.0 g/dL   RDW 14.8 (H) 11.5 - 14.5 %   Platelets 206 150 - 440  K/uL   Neutrophils Relative % 81 %   Neutro Abs 7.7 (H) 1.4 - 6.5 K/uL   Lymphocytes Relative 14 %   Lymphs Abs 1.3 1.0 - 3.6 K/uL   Monocytes Relative 5 %   Monocytes Absolute 0.5 0.2 - 0.9 K/uL   Eosinophils Relative 0 %   Eosinophils Absolute 0.0 0 - 0.7 K/uL   Basophils Relative 0 %   Basophils Absolute 0.0 0 - 0.1 K/uL  APTT     Status: None   Collection Time: 07/20/15  5:55 PM  Result Value Ref Range   aPTT 26 24 - 36 seconds  Protime-INR     Status: None   Collection Time: 07/20/15  5:55 PM  Result Value Ref Range   Prothrombin Time 13.0 11.4 - 15.0 seconds   INR 0.96   Blood Culture (routine x 2)     Status: None (Preliminary result)   Collection Time: 07/20/15  5:55 PM  Result Value Ref Range   Specimen Description BLOOD RIGHT WRIST    Special Requests BOTTLES DRAWN AEROBIC AND ANAEROBIC 3CC    Culture NO GROWTH < 12 HOURS    Report Status PENDING   Blood Culture (routine x 2)     Status: None (Preliminary result)   Collection Time: 07/20/15  7:46 PM  Result Value Ref Range   Specimen Description BLOOD LEFT HAND    Special Requests      BOTTLES DRAWN AEROBIC AND ANAEROBIC AERO [place] ANA 5CC   Culture NO GROWTH < 12 HOURS    Report Status PENDING   Lactic acid, plasma     Status: None   Collection Time: 07/20/15  9:14 PM  Result Value Ref Range   Lactic Acid, Venous 1.9 0.5 - 2.0 mmol/L  CSF cell count with differential collection tube #: 1     Status: Abnormal   Collection Time: 07/20/15 10:04 PM  Result Value Ref Range   Tube # 1    Color, CSF COLORLESS COLORLESS   Appearance, CSF CLEAR CLEAR   RBC Count, CSF 9 (H) 0 - 3 /cu mm   WBC, CSF 3 /cu mm   Monocyte-Macrophage-Spinal Fluid 100 (H) 15 - 45 %    Comment: NOTIFIED TRACEY TOOMBS ON 07/11/15 AT 5784 OF CHANGE IN PATIENT REPORT. QSD CORRECTED ON 02/23 AT 6962: PREVIOUSLY REPORTED AS 2   CSF cell count with differential collection  tube #: 4     Status: None   Collection Time: 07/20/15 10:04 PM  Result Value Ref Range   Tube # 4    Color, CSF COLORLESS COLORLESS   Appearance, CSF CLEAR CLEAR   RBC Count, CSF 3 0 - 3 /cu mm   WBC, CSF 0 /cu mm  Glucose, CSF     Status: Abnormal   Collection Time: 07/20/15 10:04 PM  Result Value Ref Range   Glucose, CSF 75 (H) 40 - 70 mg/dL  Protein, CSF     Status: Abnormal   Collection Time: 07/20/15 10:04 PM  Result Value Ref Range   Total  Protein, CSF 54 (H) 15 - 45 mg/dL  CSF culture with Stat gram stain     Status: None (Preliminary result)   Collection Time: 07/20/15 10:04 PM  Result Value Ref Range   Specimen Description CSF    Special Requests Normal    Gram Stain NO WBC SEEN NO ORGANISMS SEEN     Culture PENDING    Report Status PENDING   Rapid Influenza  A&B Antigens ([facility] only)     Status: None   Collection Time: 07/20/15 10:04 PM  Result Value Ref Range   Influenza A ([facility]) NOT DETECTED    Influenza B [facility]) NOT DETECTED   Basic metabolic panel     Status: Abnormal   Collection Time: 07/21/15  7:15 AM  Result Value Ref Range   Sodium 139 135 - 145 mmol/L   Potassium 3.4 (L) 3.5 - 5.1 mmol/L   Chloride 104 101 - 111 mmol/L   CO2 28 22 - 32 mmol/L   Glucose, Bld 93 65 - 99 mg/dL   BUN 16 6 - 20 mg/dL   Creatinine, Ser 0.53 0.44 - 1.00 mg/dL   Calcium 8.6 (L) 8.9 - 10.3 mg/dL   GFR calc non Af Amer >60 >60 mL/min   GFR calc Af Amer >60 >60 mL/min    Comment: (NOTE) The eGFR has been calculated using the CKD EPI equation. This calculation has not been validated in all clinical situations. eGFR's persistently <60 mL/min signify possible Chronic Kidney Disease.    Anion gap 7 5 - 15  CBC     Status: Abnormal   Collection Time: 07/21/15  7:15 AM  Result Value Ref Range   WBC 7.5 3.6 - 11.0 K/uL   RBC 4.15 3.80 - 5.20 MIL/uL   Hemoglobin 12.4 12.0 - 16.0 g/dL   HCT 36.5 35.0 - 47.0 %   MCV 88.1 80.0 - 100.0 fL   MCH 29.9 26.0 - 34.0 pg   MCHC 34.0  32.0 - 36.0 g/dL   RDW 14.9 (H) 11.5 - 14.5 %   Platelets 199 150 - 440 K/uL   No components found for: ESR, C REACTIVE PROTEIN MICRO: Recent Results (from the past 720 hour(s))  Blood Culture (routine x 2)     Status: None (Preliminary result)   Collection Time: 07/20/15  5:55 PM  Result Value Ref Range Status   Specimen Description BLOOD RIGHT WRIST  Final   Special Requests BOTTLES DRAWN AEROBIC AND ANAEROBIC 3CC  Final   Culture NO GROWTH < 12 HOURS  Final   Report Status PENDING  Incomplete  Blood Culture (routine x 2)     Status: None (Preliminary result)   Collection Time: 07/20/15  7:46 PM  Result Value Ref Range Status   Specimen Description BLOOD LEFT HAND  Final   Special Requests   Final    BOTTLES DRAWN AEROBIC AND ANAEROBIC AERO [place] ANA 5CC   Culture NO GROWTH < 12 HOURS  Final   Report Status PENDING  Incomplete  CSF culture with Stat gram stain     Status: None (Preliminary result)   Collection Time: 07/20/15 10:04 PM  Result Value Ref Range Status   Specimen Description CSF  Final   Special Requests Normal  Final   Gram Stain NO WBC SEEN NO ORGANISMS SEEN   Final   Culture PENDING  Incomplete   Report Status PENDING  Incomplete  Rapid Influenza A&B Antigens ([facility] only)     Status: None   Collection Time: 07/20/15 10:04 PM  Result Value Ref Range Status   Influenza A ([facility]) NOT DETECTED  Final   Influenza B ([facility]) NOT DETECTED  Final    IMAGING: Ct Head Wo Contrast  07/20/2015  CLINICAL DATA:  Pt via ems from [facility] with fever today of 102.9. They gave her 1000 tylenol suppository. Pt sleepy but responds to voice and manual stimulation. EXAM: CT HEAD  WITHOUT CONTRAST TECHNIQUE: Contiguous axial images were obtained from the base of the skull through the vertex without intravenous contrast. COMPARISON:  Head CT 09/17/2012 FINDINGS: No acute intracranial hemorrhage. No focal mass lesion. No CT evidence of acute infarction. No midline shift or  mass effect. No hydrocephalus. Basilar cisterns are patent. There is extensive periventricular white matter hypodensities. Extensive cortical atrophy and proportional ventricular dilatation. Paranasal sinuses and  mastoid air cells are clear. IMPRESSION: 1. No acute intracranial findings. 2. Extensive periventricular subcortical white matter hypodensities consistent small vessel ischemia. Not significant changed from prior MRI. 3. Generalized atrophy and ventricular dilatation is also similar Electronically Signed   By: Suzy Bouchard M.D.   On: 07/20/2015 18:32   Dg Chest Port 1 View  07/20/2015  CLINICAL DATA:  Fever today.  Drowsiness.  Initial encounter. EXAM: PORTABLE CHEST 1 VIEW COMPARISON:  None. FINDINGS: The lungs are clear. Heart size is normal. No pneumothorax or pleural effusion. IMPRESSION: No acute disease. Electronically Signed   By: Inge Rise M.D.   On: 07/20/2015 18:19    Assessment:   Kathryn Aguilar is a 82 y.o. female with dementia admitted with fevers, ams. LP is unimpressive, wbc, nml, bcx and imagine neg. Flu negative She has some conjunctivitis and her husband had recent viral infection.  No evidence systemic infection or meningitis.   Recommendations Dc abx DC precautions Symptomatic treatment Thank you very much for allowing me to participate in the care of this patient. Please call with questions.   Cheral Marker. Ola Spurr, MD

## 2015-07-21 NOTE — Progress Notes (Signed)
[facility] - [place] at [facility]   PATIENT NAME: Kathryn Aguilar    MR#:  [id number]  DATE OF BIRTH:  01-27-1926  SUBJECTIVE:   Patient presented with fever and lethargy. She is status post lumbar puncture. She is pleasantly demented.  REVIEW OF SYSTEMS:    Review of Systems  Unable to perform ROS   Tolerating Diet: Yes      DRUG ALLERGIES:  No Known Allergies  VITALS:  Blood pressure 164/78, pulse 78, temperature 99 F (37.2 C), temperature source Axillary, resp. rate 17, height  (1.651 m), weight 52.164 kg (115 lb), SpO2 97 %.  PHYSICAL EXAMINATION:   Physical Exam  Constitutional: She is well-developed, well-nourished, and in no distress. No distress.  HENT:  Head: Normocephalic and atraumatic.  Mouth/Throat: Oropharynx is clear and moist. No oropharyngeal exudate.  Eyes: No scleral icterus.  Neck: Normal range of motion. Neck supple. No JVD present. No tracheal deviation present.  Cardiovascular: Normal rate, regular rhythm and normal heart sounds.  Exam reveals no gallop and no friction rub.   No murmur heard. Pulmonary/Chest: Effort normal and breath sounds normal. No respiratory distress. She has no wheezes. She has no rales. She exhibits no tenderness.  Abdominal: Soft. Bowel sounds are normal. She exhibits no distension and no mass. There is no tenderness. There is no rebound and no guarding.  Musculoskeletal: She exhibits no edema.  Neurological: She is alert.  Skin: Skin is warm. No rash noted. No erythema.  Psychiatric: Affect and judgment normal.      LABORATORY PANEL:   CBC  Recent Labs Lab 07/21/15 0715  WBC 7.5  HGB 12.4  HCT 36.5  PLT 199   ------------------------------------------------------------------------------------------------------------------  Chemistries   Recent Labs Lab 07/20/15 1755 07/21/15 0715  NA 139 139  K 4.1 3.4*  CL 104 104  CO2 26 28  GLUCOSE 124* 93  BUN 25* 16   CREATININE 0.68 0.53  CALCIUM 9.2 8.6*  AST 23  --   ALT 17  --   ALKPHOS 102  --   BILITOT 0.5  --    ------------------------------------------------------------------------------------------------------------------  Cardiac Enzymes  Recent Labs Lab 07/20/15 1755  TROPONINI <0.03   ------------------------------------------------------------------------------------------------------------------  RADIOLOGY:  Ct Head Wo Contrast  07/20/2015  CLINICAL DATA:  Pt via ems from [facility] with fever today of 102.9. They gave her 1000 tylenol suppository. Pt sleepy but responds to voice and manual stimulation. EXAM: CT HEAD WITHOUT CONTRAST TECHNIQUE: Contiguous axial images were obtained from the base of the skull through the vertex without intravenous contrast. COMPARISON:  Head CT 09/17/2012 FINDINGS: No acute intracranial hemorrhage. No focal mass lesion. No CT evidence of acute infarction. No midline shift or mass effect. No hydrocephalus. Basilar cisterns are patent. There is extensive periventricular white matter hypodensities. Extensive cortical atrophy and proportional ventricular dilatation. Paranasal sinuses and  mastoid air cells are clear. IMPRESSION: 1. No acute intracranial findings. 2. Extensive periventricular subcortical white matter hypodensities consistent small vessel ischemia. Not significant changed from prior MRI. 3. Generalized atrophy and ventricular dilatation is also similar Electronically Signed   By: Genevive Bi M.D.   On: 07/20/2015 18:32   Dg Chest Port 1 View  07/20/2015  CLINICAL DATA:  Fever today.  Drowsiness.  Initial encounter. EXAM: PORTABLE CHEST 1 VIEW COMPARISON:  None. FINDINGS: The lungs are clear. Heart size is normal. No pneumothorax or pleural effusion. IMPRESSION: No acute disease. Electronically Signed   By: Drusilla Kanner  M.D.   On: 07/20/2015 18:19     ASSESSMENT AND PLAN:   A 21 female with history of dementia and  essential hypertension presented from [facility] facility due to fever and lethargy.   1. Fever with acute encephalopathy: LP shows monocytes. It is unlikely patient has meningitis. ID consult is pending. Follow-up on final results of CSF culture. Continue current antibiotics. Chest x-ray and urinalysis to show evidence of pneumonia. Influenza was negative. Follow up on blood cultures.    2. Essential hypertension:  Blood pressure elevated this morning but did not receive her medications and therefore will restart Norvasc 3. Dementia: Continue Namenda and Aricept.   4. Hypokalemia: Replace and. In a.m.  CODE STATUS: FULL  TOTAL TIME TAKING CARE OF THIS PATIENT: 30 minutes.     POSSIBLE D/C [facility], DEPENDING ON CLINICAL CONDITION.   Earma Nicolaou M.D on 07/21/2015 at 10:32 AM  Between 7am to 6pm - Pager - [phone] After 6pm go to [email or web address] - password [email or web address] [facility]  [facility]  Office  [phone]  CC: Primary care physician; Clydie Braun, MD  Note: This dictation was prepared with Dragon dictation along with smaller phrase technology. Any transcriptional errors that result from this process are unintentional.

## 2015-07-21 NOTE — Evaluation (Signed)
Clinical/Bedside Swallow Evaluation Patient Details  Name: Kathryn Aguilar MRN: [id number] Date of Birth: 10/31/1925  Today's Date: 07/21/2015 Time: SLP Start Time (ACUTE ONLY): 1445 SLP Stop Time (ACUTE ONLY): 1545 SLP Time Calculation (min) (ACUTE ONLY): 60 min  Past Medical History:  Past Medical History  Diagnosis Date  . Compression fracture   . Dementia   . Hypertension   . Macular degeneration   . Hyperlipemia    Past Surgical History:  Past Surgical History  Procedure Laterality Date  . Cataract extraction    . Skin cancer excision    . Flexible sigmoidoscopy N/A 11/19/2014    Procedure: FLEXIBLE SIGMOIDOSCOPY;  Surgeon: Elnita Maxwell, MD;  Location: [facility] ENDOSCOPY;  Service: Endoscopy;  Laterality: N/A;   HPI:  Pt is a 82 y.o. female with a known history of dementia, hypertension presents to the emergency room from [facility] due to fever and drowsiness. Patient was noticed to have fever 102.9 brought to the emergency room after giving her Tylenol suppository. Here her temperature is 99. Chest x-ray is clear. Urinalysis showed no UTI. Lumbar puncture has been done.At baseline patient can carry some conversations although is confused most or times. Ambulates with a walker with difficulty. Her husband is in independent living area off and [employer]. Presently he is admitted here to the hospital with C. Difficile. Pt has had a cold per notes. Pt is awake, severely confused evidenced by her actions and speech. She was able to answer only 2-3 basic questions re: eating; oriented to her first name but not her husband's or any other family members when asked if they have children. She required max. cues for follow through w/ tasks.   Assessment / Plan / Recommendation Clinical Impression  Pt appeared to adequately tolerate trials of thin liquids and purees w/ no immediate, overt s/s of aspiration noted, however, d/t her d/t declined Cognitive status/attention  to eating/drinking tasks, pt exhibited prolonged oral phase bolus management. Given time, pt swallowed and cleared appropriately. She quickly declined further po trials stating "that is enough"; only bites and sips had been taken. Pt required feeding support when holding cup and drinking; feeding of foods. Verbal cues to given to redirect attention to task. Pt was only oriented to self. Pt is at increased risk for aspiration currently sec. to declined cognitive status/Dementia and overt oral phase dysphagia. Rec. pureed diet w/ thin liquids; strict aspiration precautions; feeding assistance at all meals; meds crushed in puree w/ NSG.     Aspiration Risk  Mild aspiration risk    Diet Recommendation  Dys. 1(puree) w/ thin liquids; strict aspiration precautions; feeding assistance at all meals.   Medication Administration: Crushed with puree    Other  Recommendations Recommended Consults:  (Dietician for supplemental support) Oral Care Recommendations: Oral care BID;Staff/trained caregiver to provide oral care   Follow up Recommendations   (TBD)    Frequency and Duration min 3x week  2 weeks       Prognosis Prognosis for Safe Diet Advancement: Fair Barriers to Reach Goals: Cognitive deficits;Severity of deficits      Swallow Study   General Date of Onset: 07/20/15 HPI: Pt is a 82 y.o. female with a known history of dementia, hypertension presents to the emergency room from [facility] due to fever and drowsiness. Patient was noticed to have fever 102.9 brought to the emergency room after giving her Tylenol suppository. Here her temperature is 99. Chest x-ray is clear.  Urinalysis showed no UTI. Lumbar puncture has been done.At baseline patient can carry some conversations although is confused most or times. Ambulates with a walker with difficulty. Her husband is in independent living area off and [employer]. Presently he is admitted here to the hospital with C. Difficile. Pt  has had a cold per notes. Pt is awake, severely confused evidenced by her actions and speech. She was able to answer only 2-3 basic questions re: eating; oriented to her first name but not her husband's or any other family members when asked if they have children. She required max. cues for follow through w/ tasks. Type of Study: Bedside Swallow Evaluation Previous Swallow Assessment: none indicated per chart Diet Prior to this Study:  (unknown at NH; on a pureed diet currently per MD) Temperature Spikes Noted: No (wbc 7.5) Respiratory Status: Room air History of Recent Intubation: No Behavior/Cognition: Pleasant mood;Confused;Requires cueing;Distractible (awake; initially disrobed) Oral Cavity Assessment:  (min. assessment sec. to declined Cognitive status) Oral Care Completed by SLP: Yes Oral Cavity - Dentition: Adequate natural dentition Vision:  (n/a) Self-Feeding Abilities: Total assist (sec. to Cognition) Patient Positioning: Upright in bed Baseline Vocal Quality: Normal;Low vocal intensity Volitional Cough: Cognitively unable to elicit Volitional Swallow: Unable to elicit    Oral/Motor/Sensory Function Overall Oral Motor/Sensory Function:  (appeared wfl w/ bolus management/trials)   Ice Chips Ice chips: Within functional limits Presentation: Spoon (fed; 3 trials)   Thin Liquid Thin Liquid: Impaired Presentation: Cup;Self Fed (assisted; 4 presentations w/ multiple, small sips each one) Oral Phase Impairments:  (none) Oral Phase Functional Implications: Prolonged oral transit (intermittently) Pharyngeal  Phase Impairments:  (none noted w/ trials) Other Comments: decreased attention to task intermittently    Nectar Thick Nectar Thick Liquid: Not tested   Honey Thick Honey Thick Liquid: Not tested   Puree Puree: Impaired Presentation: Spoon (fed; 6 trials accepted) Oral Phase Functional Implications: Prolonged oral transit (intermittently) Pharyngeal Phase Impairments:   (none) Other Comments: decreased attention to task intermittently   Solid   GO   Solid: Not tested Other Comments: d/t declined Cognitive status/attention to tasks        Jerilynn Som, MS, CCC-SLP  Watson,Katherine 07/21/2015,3:51 PM

## 2015-07-22 LAB — URINE CULTURE: Culture: NO GROWTH

## 2015-07-22 LAB — BASIC METABOLIC PANEL
ANION GAP: 10 (ref 5–15)
BUN: 18 mg/dL (ref 6–20)
CHLORIDE: 103 mmol/L (ref 101–111)
CO2: 27 mmol/L (ref 22–32)
Calcium: 8.9 mg/dL (ref 8.9–10.3)
Creatinine, Ser: 0.62 mg/dL (ref 0.44–1.00)
GFR calc non Af Amer: >60 mL/min (ref >60)
Glucose, Bld: 90 mg/dL (ref 65–99)
POTASSIUM: 3.4 mmol/L — AB (ref 3.5–5.1)
Sodium: 140 mmol/L (ref 135–145)

## 2015-07-22 LAB — HERPES SIMPLEX VIRUS(HSV) DNA BY PCR
HSV 1 DNA: NEGATIVE
HSV 2 DNA: NEGATIVE

## 2015-07-22 LAB — MRSA PCR SCREENING: MRSA BY PCR: NEGATIVE

## 2015-07-22 MED ORDER — CIPROFLOXACIN HCL 0.3 % OP SOLN
1.0000 [drp] | OPHTHALMIC | Status: DC
  Filled 2015-07-22: qty 2.5

## 2015-07-22 MED ORDER — ERYTHROMYCIN 5 MG/GM OP OINT
TOPICAL_OINTMENT | Freq: Three times a day (TID) | OPHTHALMIC | Status: DC
  Administered 2015-07-22: 1 via OPHTHALMIC
  Filled 2015-07-22: qty 3.5

## 2015-07-22 MED ORDER — CIPROFLOXACIN HCL 0.3 % OP SOLN: 1.0000 [drp] | OPHTHALMIC | Status: DC

## 2015-07-22 MED ORDER — ERYTHROMYCIN 5 MG/GM OP OINT: TOPICAL_OINTMENT | Freq: Three times a day (TID) | OPHTHALMIC | Status: AC

## 2015-07-22 NOTE — Clinical Social Work Note (Signed)
MD to discharge patient today to return to [facility]. CSW went to inform patient's husband but he was sleeping so CSW informed patient's son: Onalee Hua: [phone] who was visiting both his dad and patient this morning. Onalee Hua prefers that patient be transported by [facility] if possible. CSW contacted Irving Burton at [facility] and they can take patient back today and stated that they are not able to transport and would recommend EMS. Patient's son informed and he is in agreement. Discharge information sent to [place] at [facility].  York Spaniel MSW,LCSW [phone]

## 2015-07-22 NOTE — Progress Notes (Signed)
Report called to [place] at [facility]. EMS will transport pt.

## 2015-07-22 NOTE — [place] FL2 (Signed)
[place] MEDICAID FL2 LEVEL OF CARE SCREENING TOOL     IDENTIFICATION  Patient Name: Kathryn Aguilar Birthdate: 30-Sep-1925 Sex: female Admission Date (Current Location): 07/20/2015  [place] and [place] Number:  Chiropodist and Address:  [facility], [address], [place], [place] [address]      Provider Number: [id number]  Attending Physician Name and Address:  Adrian Saran, MD  Relative Name and Phone Number:       Current Level of Care: Hospital Recommended Level of Care: Memory Care, Assisted Living Facility Prior Approval Number:    Date Approved/Denied:   PASRR Number:    Discharge Plan:  (memory care alf)    Current Diagnoses: Patient Active Problem List   Diagnosis Date Noted  . Sepsis (HCC) 07/20/2015  . Lower GI bleed 11/18/2014    Orientation RESPIRATION BLADDER Height & Weight     Self  Normal Continent Weight: 126 lb 1.6 oz (57.199 kg) Height:   (165.1 cm)  BEHAVIORAL SYMPTOMS/MOOD NEUROLOGICAL BOWEL NUTRITION STATUS   (none)   Continent Diet  AMBULATORY STATUS COMMUNICATION OF NEEDS Skin   Limited Assist Verbally Normal                       Personal Care Assistance Level of Assistance  Bathing, Feeding, Dressing Bathing Assistance: Limited assistance Feeding assistance: Limited assistance Dressing Assistance: Limited assistance     Functional Limitations Info             SPECIAL CARE FACTORS FREQUENCY  PT (By licensed PT)                    Contractures Contractures Info: Not present    Additional Factors Info                  Current Discharge Medication List    START taking these medications   Details  erythromycin ophthalmic ointment Place into the left eye every 8 (eight) hours. Qty: 3.5 g, Refills: 0      CONTINUE these medications which have NOT CHANGED   Details  !! acetaminophen (TYLENOL) 325 MG tablet Take 650 mg by mouth 3 (three) times daily.     !! acetaminophen (TYLENOL) 500 MG tablet Take 500 mg by mouth 2 (two) times daily as needed for mild pain.    acetaminophen (TYLENOL) 650 MG suppository Place 650 mg rectally every 4 (four) hours as needed for fever.    aluminum hydroxide (DERMAGRAN) ointment Apply 1 application topically 3 (three) times daily.    amLODipine (NORVASC) 2.5 MG tablet Take 2.5 mg by mouth daily.    donepezil (ARICEPT) 10 MG tablet Take 10 mg by mouth at bedtime.    latanoprost (XALATAN) 0.005 % ophthalmic solution Place 1 drop into both eyes at bedtime.    memantine (NAMENDA) 10 MG tablet Take 10 mg by mouth 2 (two) times daily.    Multiple Vitamins-Minerals (ICAPS AREDS 2 PO) Take 1 each by mouth 2 (two) times daily.    polyethylene glycol (MIRALAX / GLYCOLAX) packet Take 17 g by mouth daily. Qty: 14 each, Refills: 0    polyvinyl alcohol (LIQUIFILM TEARS) 1.4 % ophthalmic solution Place 1 drop into both eyes 4 (four) times daily.    Vitamin D, Ergocalciferol, (DRISDOL) 50000 units CAPS capsule Take 50,000 Units by mouth every 30         Discharge Medications: Please see discharge summary for a list of discharge  medications.  Relevant Imaging Results:  Relevant Lab Results:   Additional Information    York Spaniel, LCSW

## 2015-07-22 NOTE — Discharge Summary (Addendum)
[facility] [place] at [facility]   PATIENT NAME: Kathryn Aguilar    MR#:  [id number]  DATE OF BIRTH:  1925-12-05  DATE OF ADMISSION:  07/20/2015 ADMITTING PHYSICIAN: Milagros Loll, MD  DATE OF DISCHARGE: *07/22/2015  PRIMARY CARE PHYSICIAN: Clydie Braun, MD    ADMISSION DIAGNOSIS:  Lethargy [R53.83] Chronic dementia, without behavioral disturbance [F03.90] Sepsis, due to unspecified organism (HCC) [A41.9] Fever, unspecified fever cause [R50.9]  DISCHARGE DIAGNOSIS:  Active Problems:   Sepsis (HCC)   SECONDARY DIAGNOSIS:   Past Medical History  Diagnosis Date  . Compression fracture   . Dementia   . Hypertension   . Macular degeneration   . Hyperlipemia     HOSPITAL COURSE:    A 39 female with history of dementia and essential hypertension presented from [facility] due to fever and lethargy.   1. Fever with acute encephalopathy:She had LP in ED for fever and lethargy. This was unremarakable and did not show any signs of meningitis. Chest x-ray and urinalysis to show evidence of pneumonia. Influenza was negative. Follow up on blood cultures. All antibiotics were discontinued. Her husband had a virla infection and she has viral conjunctivitis. She is at her baseline  2. Essential hypertension: Continue Norvasc at discharge.  3. Dementia: Continue Namenda and Aricept.   4. Hypokalemia: Replaced   DISCHARGE CONDITIONS AND DIET:   Stable DYSPHAGIA 1 diet  CONSULTS OBTAINED:  Treatment Team:  Clydie Braun, MD  DRUG ALLERGIES:  No Known Allergies  DISCHARGE MEDICATIONS:   Current Discharge Medication List    START taking these medications   Details  erythromycin ophthalmic ointment Place into the left eye every 8 (eight) hours. Qty: 3.5 g, Refills: 0      CONTINUE these medications which have NOT CHANGED   Details  !! acetaminophen (TYLENOL) 325 MG tablet Take 650 mg by mouth 3 (three)  times daily.    !! acetaminophen (TYLENOL) 500 MG tablet Take 500 mg by mouth 2 (two) times daily as needed for mild pain.    acetaminophen (TYLENOL) 650 MG suppository Place 650 mg rectally every 4 (four) hours as needed for fever.    aluminum hydroxide (DERMAGRAN) ointment Apply 1 application topically 3 (three) times daily.    amLODipine (NORVASC) 2.5 MG tablet Take 2.5 mg by mouth daily.    donepezil (ARICEPT) 10 MG tablet Take 10 mg by mouth at bedtime.    latanoprost (XALATAN) 0.005 % ophthalmic solution Place 1 drop into both eyes at bedtime.    memantine (NAMENDA) 10 MG tablet Take 10 mg by mouth 2 (two) times daily.    Multiple Vitamins-Minerals (ICAPS AREDS 2 PO) Take 1 each by mouth 2 (two) times daily.    polyethylene glycol (MIRALAX / GLYCOLAX) packet Take 17 g by mouth daily. Qty: 14 each, Refills: 0    polyvinyl alcohol (LIQUIFILM TEARS) 1.4 % ophthalmic solution Place 1 drop into both eyes 4 (four) times daily.    Vitamin D, Ergocalciferol, (DRISDOL) 50000 units CAPS capsule Take 50,000 Units by mouth every 30 (thirty) days. Pt takes on the 30th of every month.     !! - Potential duplicate medications found. Please discuss with provider.            Today   CHIEF COMPLAINT:  No acute issues overnight patient pleasantly demented   VITAL SIGNS:  Blood pressure 169/87, pulse 84, temperature 98.3 F (36.8 C), temperature source Oral, resp. rate 16, height 5'  5" (1.651 m), weight 57.199 kg (126 lb 1.6 oz), SpO2 95 %.   REVIEW OF SYSTEMS:  Review of Systems  Unable to perform ROS  dementia  PHYSICAL EXAMINATION:  Physical Exam  Constitutional: Advanced dementia, awake, interactive,nad HENT: [place]/AT, PERRLA, no scleral icterus + crusting left eye Mouth/Throat: Oropharynx is clear and moist. No oropharyngeal exudate.  Cardiovascular: Normal rate, regular rhythm and normal heart sounds. Pulmonary/Chest: Effort normal and breath sounds normal. No  respiratory distress. has no wheezes.  Neck supple, no nuchal rigidity Abdominal: Soft. Bowel sounds are normal. exhibits no distension. There is no tenderness.  Lymphadenopathy: no cervical adenopathy. No axillary adenopathy Neurological: advanced dementia,cannot tell me where she is or even her husbands name.  Skin: Skin is warm and dry. No rash noted. No erythema.  Psychiatric: pleasantly demented   DATA REVIEW:   CBC  Recent Labs Lab 07/21/15 0715  WBC 7.5  HGB 12.4  HCT 36.5  PLT 199    Chemistries   Recent Labs Lab 07/20/15 1755  07/22/15 0711  NA 139  < > 140  K 4.1  < > 3.4*  CL 104  < > 103  CO2 26  < > 27  GLUCOSE 124*  < > 90  BUN 25*  < > 18  CREATININE 0.68  < > 0.62  CALCIUM 9.2  < > 8.9  AST 23  --   --   ALT 17  --   --   ALKPHOS 102  --   --   BILITOT 0.5  --   --   < > = values in this interval not displayed.  Cardiac Enzymes  Recent Labs Lab 07/20/15 1755  TROPONINI <0.03    Microbiology Results  @MICRORSLT48 @  RADIOLOGY:  Ct Head Wo Contrast  07/20/2015  CLINICAL DATA:  Pt via ems from [facility] with fever today of 102.9. They gave her 1000 tylenol suppository. Pt sleepy but responds to voice and manual stimulation. EXAM: CT HEAD WITHOUT CONTRAST TECHNIQUE: Contiguous axial images were obtained from the base of the skull through the vertex without intravenous contrast. COMPARISON:  Head CT 09/17/2012 FINDINGS: No acute intracranial hemorrhage. No focal mass lesion. No CT evidence of acute infarction. No midline shift or mass effect. No hydrocephalus. Basilar cisterns are patent. There is extensive periventricular white matter hypodensities. Extensive cortical atrophy and proportional ventricular dilatation. Paranasal sinuses and  mastoid air cells are clear. IMPRESSION: 1. No acute intracranial findings. 2. Extensive periventricular subcortical white matter hypodensities consistent small vessel ischemia. Not significant  changed from prior MRI. 3. Generalized atrophy and ventricular dilatation is also similar Electronically Signed   By: Genevive Bi M.D.   On: 07/20/2015 18:32   Dg Chest Port 1 View  07/20/2015  CLINICAL DATA:  Fever today.  Drowsiness.  Initial encounter. EXAM: PORTABLE CHEST 1 VIEW COMPARISON:  None. FINDINGS: The lungs are clear. Heart size is normal. No pneumothorax or pleural effusion. IMPRESSION: No acute disease. Electronically Signed   By: Drusilla Kanner M.D.   On: 07/20/2015 18:19      MStable for discharge SNF  Patient should follow up with PCP in1 week  CODE STATUS:     Code Status Orders        Start     Ordered   07/20/15 2354  Full code   Continuous     07/20/15 2354    Code Status History    Date Active Date Inactive Code Status Order ID  Comments User Context   11/18/2014  3:36 PM 11/22/2014  8:34 PM DNR [id number]  Auburn Bilberry, MD Inpatient      TOTAL TIME TAKING CARE OF THIS PATIENT: 35 minutes.    Note: This dictation was prepared with Dragon dictation along with smaller phrase technology. Any transcriptional errors that result from this process are unintentional.  Zenovia Justman M.D on 07/22/2015 at 8:59 AM  Between 7am to 6pm - Pager - [phone] After 6pm go to [email or web address] - password [email or web address] [facility]  [place] [facility]  Office  [phone]  CC: Primary care physician; Clydie Braun, MD

## 2015-07-22 NOTE — Care Management Important Message (Signed)
Important Message  Patient Details  Name: Kathryn Aguilar MRN: [id number] Date of Birth: 05-11-1926   Medicare Important Message Given:  Yes    [name] 07/22/2015, 9:28 AM

## 2015-07-25 DIAGNOSIS — R509 Fever, unspecified: Secondary | ICD-10-CM | POA: Diagnosis not present

## 2015-07-25 DIAGNOSIS — R4702 Dysphasia: Secondary | ICD-10-CM | POA: Diagnosis not present

## 2015-07-25 LAB — CSF CULTURE

## 2015-07-25 LAB — CSF CULTURE W GRAM STAIN
Culture: NO GROWTH
Gram Stain: NONE SEEN
Special Requests: NORMAL

## 2015-07-26 LAB — CULTURE, BLOOD (ROUTINE X 2)
CULTURE: NO GROWTH
Culture: NO GROWTH

## 2015-09-07 DIAGNOSIS — I10 Essential (primary) hypertension: Secondary | ICD-10-CM | POA: Diagnosis not present

## 2015-09-07 DIAGNOSIS — S32000S Wedge compression fracture of unspecified lumbar vertebra, sequela: Secondary | ICD-10-CM | POA: Diagnosis not present

## 2015-09-07 DIAGNOSIS — M1990 Unspecified osteoarthritis, unspecified site: Secondary | ICD-10-CM | POA: Diagnosis not present

## 2015-09-07 DIAGNOSIS — G309 Alzheimer's disease, unspecified: Secondary | ICD-10-CM | POA: Diagnosis not present

## 2015-11-10 DIAGNOSIS — G301 Alzheimer's disease with late onset: Secondary | ICD-10-CM | POA: Diagnosis not present

## 2015-11-10 DIAGNOSIS — M159 Polyosteoarthritis, unspecified: Secondary | ICD-10-CM | POA: Diagnosis not present

## 2015-11-10 DIAGNOSIS — I10 Essential (primary) hypertension: Secondary | ICD-10-CM | POA: Diagnosis not present

## 2015-12-28 DIAGNOSIS — M1990 Unspecified osteoarthritis, unspecified site: Secondary | ICD-10-CM | POA: Diagnosis not present

## 2015-12-28 DIAGNOSIS — G309 Alzheimer's disease, unspecified: Secondary | ICD-10-CM | POA: Diagnosis not present

## 2015-12-28 DIAGNOSIS — I10 Essential (primary) hypertension: Secondary | ICD-10-CM | POA: Diagnosis not present

## 2016-02-23 DIAGNOSIS — R509 Fever, unspecified: Secondary | ICD-10-CM | POA: Diagnosis not present

## 2016-03-09 DIAGNOSIS — I10 Essential (primary) hypertension: Secondary | ICD-10-CM | POA: Diagnosis not present

## 2016-03-09 DIAGNOSIS — M159 Polyosteoarthritis, unspecified: Secondary | ICD-10-CM | POA: Diagnosis not present

## 2016-03-09 DIAGNOSIS — G301 Alzheimer's disease with late onset: Secondary | ICD-10-CM

## 2016-05-11 DIAGNOSIS — M1990 Unspecified osteoarthritis, unspecified site: Secondary | ICD-10-CM | POA: Diagnosis not present

## 2016-05-11 DIAGNOSIS — G309 Alzheimer's disease, unspecified: Secondary | ICD-10-CM | POA: Diagnosis not present

## 2016-05-11 DIAGNOSIS — I10 Essential (primary) hypertension: Secondary | ICD-10-CM | POA: Diagnosis not present

## 2016-05-31 DIAGNOSIS — R509 Fever, unspecified: Secondary | ICD-10-CM | POA: Diagnosis not present

## 2016-06-06 DIAGNOSIS — R1310 Dysphagia, unspecified: Secondary | ICD-10-CM | POA: Diagnosis not present

## 2016-06-06 DIAGNOSIS — J069 Acute upper respiratory infection, unspecified: Secondary | ICD-10-CM | POA: Diagnosis not present

## 2016-06-06 DIAGNOSIS — R5383 Other fatigue: Secondary | ICD-10-CM | POA: Diagnosis not present

## 2016-06-06 DIAGNOSIS — R630 Anorexia: Secondary | ICD-10-CM | POA: Diagnosis not present

## 2016-06-28 DEATH — deceased

## 2018-02-05 IMAGING — DX DG CHEST 1V PORT
1 series · 1 of 1 positions shown · non-contrast
Comparison: None.

CLINICAL DATA: Fever today.  Drowsiness.  Initial encounter.

EXAM:
PORTABLE CHEST 1 VIEW

[chest ap]
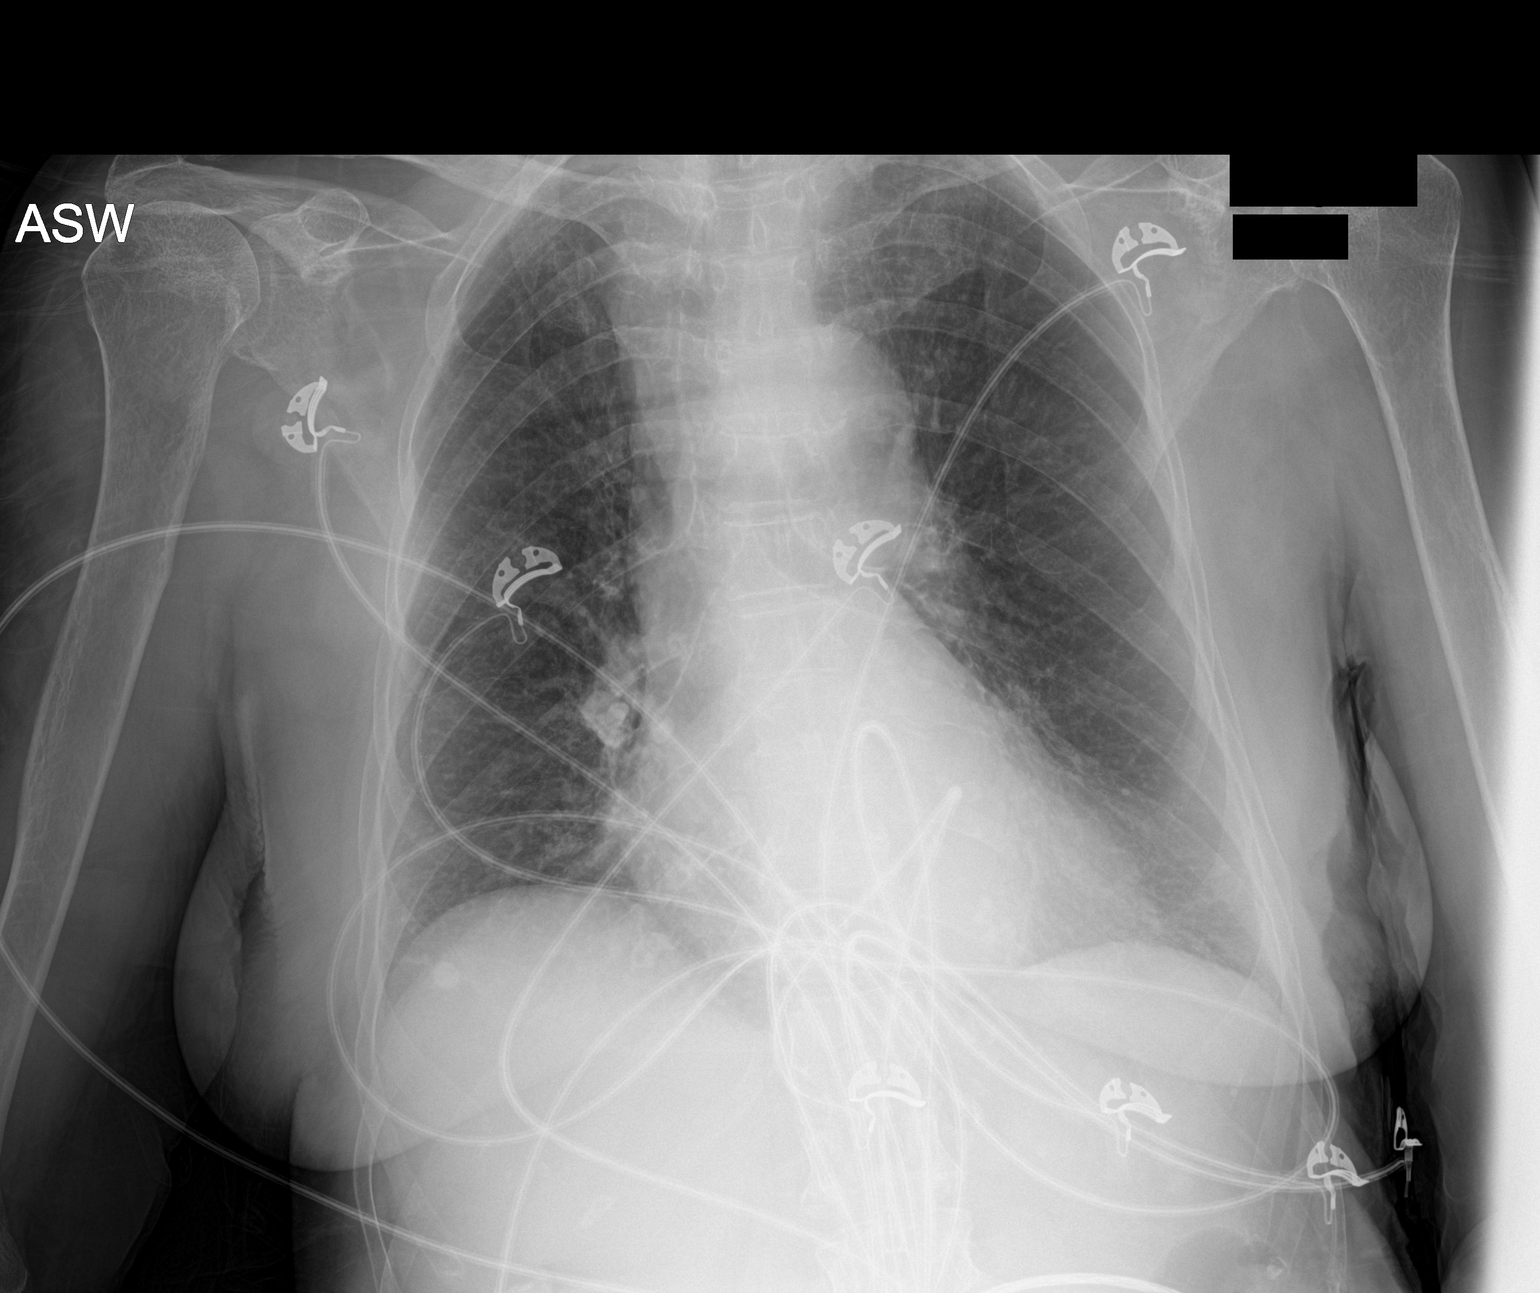

[1 of 1 positions shown; findings below may reference images not displayed]

FINDINGS: The lungs are clear. Heart size is normal. No pneumothorax or
pleural effusion.
IMPRESSION: No acute disease.
